# Patient Record
Sex: Male | Born: 1935
Health system: Southern US, Community
[De-identification: ages and names within clinical notes are randomized; demographics above are authoritative.]

## PROBLEM LIST (undated history)

## (undated) DIAGNOSIS — I639 Cerebral infarction, unspecified: Secondary | ICD-10-CM

## (undated) DIAGNOSIS — Z87442 Personal history of urinary calculi: Secondary | ICD-10-CM

## (undated) DIAGNOSIS — M1990 Unspecified osteoarthritis, unspecified site: Secondary | ICD-10-CM

## (undated) DIAGNOSIS — K219 Gastro-esophageal reflux disease without esophagitis: Secondary | ICD-10-CM

## (undated) DIAGNOSIS — N210 Calculus in bladder: Secondary | ICD-10-CM

## (undated) DIAGNOSIS — H9190 Unspecified hearing loss, unspecified ear: Secondary | ICD-10-CM

## (undated) HISTORY — PX: BACK SURGERY: SHX140

## (undated) HISTORY — PX: LITHOTRIPSY: SUR834

---

## 2004-04-04 ENCOUNTER — Inpatient Hospital Stay (HOSPITAL_COMMUNITY): Admission: RE | Admit: 2004-04-04 | Discharge: 2004-04-05 | Payer: Self-pay | Admitting: Neurosurgery

## 2006-03-23 DIAGNOSIS — I639 Cerebral infarction, unspecified: Secondary | ICD-10-CM

## 2006-03-23 HISTORY — DX: Cerebral infarction, unspecified: I63.9

## 2016-08-12 ENCOUNTER — Other Ambulatory Visit: Payer: Self-pay | Admitting: Neurosurgery

## 2016-08-12 DIAGNOSIS — M47816 Spondylosis without myelopathy or radiculopathy, lumbar region: Secondary | ICD-10-CM

## 2016-08-25 ENCOUNTER — Ambulatory Visit
Admission: RE | Admit: 2016-08-25 | Discharge: 2016-08-25 | Disposition: A | Discharge status: Home / Self Care | Payer: Medicare Other | Source: Ambulatory Visit | Attending: Neurosurgery | Admitting: Neurosurgery

## 2016-08-25 DIAGNOSIS — M47816 Spondylosis without myelopathy or radiculopathy, lumbar region: Secondary | ICD-10-CM

## 2016-08-25 MED ORDER — METHYLPREDNISOLONE ACETATE 40 MG/ML INJ SUSP (RADIOLOG
120.0000 mg | Freq: Once | INTRAMUSCULAR | Status: AC
  Administered 2016-08-25: 120 mg via INTRA_ARTICULAR

## 2016-08-25 MED ORDER — IOPAMIDOL (ISOVUE-M 200) INJECTION 41%
1.0000 mL | Freq: Once | INTRAMUSCULAR | Status: AC
  Administered 2016-08-25: 1 mL via INTRA_ARTICULAR

## 2016-08-25 NOTE — Discharge Instructions (Signed)

## 2016-12-11 ENCOUNTER — Other Ambulatory Visit: Payer: Self-pay | Admitting: Nurse Practitioner

## 2016-12-11 DIAGNOSIS — M47816 Spondylosis without myelopathy or radiculopathy, lumbar region: Secondary | ICD-10-CM

## 2016-12-24 ENCOUNTER — Ambulatory Visit
Admission: RE | Admit: 2016-12-24 | Discharge: 2016-12-24 | Disposition: A | Discharge status: Home / Self Care | Payer: Medicare Other | Source: Ambulatory Visit | Attending: Nurse Practitioner | Admitting: Nurse Practitioner

## 2016-12-24 DIAGNOSIS — M47816 Spondylosis without myelopathy or radiculopathy, lumbar region: Secondary | ICD-10-CM

## 2016-12-24 MED ORDER — IOPAMIDOL (ISOVUE-M 200) INJECTION 41%
10.0000 mL | Freq: Once | INTRAMUSCULAR | Status: AC
  Administered 2016-12-24: 10 mL via INTRA_ARTICULAR

## 2016-12-24 MED ORDER — METHYLPREDNISOLONE ACETATE 40 MG/ML INJ SUSP (RADIOLOG
120.0000 mg | Freq: Once | INTRAMUSCULAR | Status: AC
  Administered 2016-12-24: 120 mg via INTRA_ARTICULAR

## 2016-12-24 NOTE — Discharge Instructions (Signed)
Joint Injection Discharge Instructions  1. After your joint injection, use ice to the affected area for the next 24 hours as a temporary increase in pain is not uncommon for a day or two after your procedure.  2. Resume all medications unless otherwise instructed.  3. Common side effects of steroids include facial flushing or redness, restlessness or inability to sleep and an increase in your blood sugar if you are a diabetic.    4. Follow up with the ordering physician for post care.  5. If you have any of the following please call [phone]:      Temperature greater than 101     Pain, redness or swelling at the injection site   [place]!

## 2017-01-30 ENCOUNTER — Emergency Department (HOSPITAL_COMMUNITY): Payer: Medicare Other

## 2017-01-30 ENCOUNTER — Encounter (HOSPITAL_COMMUNITY): Payer: Self-pay | Admitting: Emergency Medicine

## 2017-01-30 ENCOUNTER — Emergency Department (HOSPITAL_COMMUNITY)
Admission: EM | Admit: 2017-01-30 | Discharge: 2017-01-30 | Disposition: A | Discharge status: Home / Self Care | Payer: Medicare Other | Attending: Emergency Medicine | Admitting: Emergency Medicine

## 2017-01-30 DIAGNOSIS — Y9389 Activity, other specified: Secondary | ICD-10-CM | POA: Diagnosis not present

## 2017-01-30 DIAGNOSIS — Y92411 Interstate highway as the place of occurrence of the external cause: Secondary | ICD-10-CM | POA: Insufficient documentation

## 2017-01-30 DIAGNOSIS — Y999 Unspecified external cause status: Secondary | ICD-10-CM | POA: Diagnosis not present

## 2017-01-30 DIAGNOSIS — R0789 Other chest pain: Secondary | ICD-10-CM | POA: Insufficient documentation

## 2017-01-30 DIAGNOSIS — M25552 Pain in left hip: Secondary | ICD-10-CM | POA: Diagnosis not present

## 2017-01-30 DIAGNOSIS — Z8673 Personal history of transient ischemic attack (TIA), and cerebral infarction without residual deficits: Secondary | ICD-10-CM | POA: Diagnosis not present

## 2017-01-30 HISTORY — DX: Cerebral infarction, unspecified: I63.9

## 2017-01-30 HISTORY — DX: Unspecified osteoarthritis, unspecified site: M19.90

## 2017-01-30 HISTORY — DX: Unspecified hearing loss, unspecified ear: H91.90
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
## 2017-01-30 NOTE — ED Notes (Signed)
Pt transported to radiology.
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
## 2017-09-24 ENCOUNTER — Ambulatory Visit: Payer: Medicare Other | Admitting: Urology

## 2017-09-24 DIAGNOSIS — R3915 Urgency of urination: Secondary | ICD-10-CM

## 2017-09-24 DIAGNOSIS — R351 Nocturia: Secondary | ICD-10-CM | POA: Diagnosis not present

## 2017-10-20 ENCOUNTER — Ambulatory Visit (INDEPENDENT_AMBULATORY_CARE_PROVIDER_SITE_OTHER): Payer: Medicare Other | Admitting: Urology

## 2017-10-20 DIAGNOSIS — R351 Nocturia: Secondary | ICD-10-CM

## 2017-10-20 DIAGNOSIS — R3915 Urgency of urination: Secondary | ICD-10-CM

## 2017-10-26 ENCOUNTER — Other Ambulatory Visit: Payer: Self-pay | Admitting: Neurological Surgery

## 2017-10-26 ENCOUNTER — Other Ambulatory Visit: Payer: Self-pay | Admitting: Nurse Practitioner

## 2017-10-26 DIAGNOSIS — M47816 Spondylosis without myelopathy or radiculopathy, lumbar region: Secondary | ICD-10-CM

## 2017-11-05 ENCOUNTER — Ambulatory Visit
Admission: RE | Admit: 2017-11-05 | Discharge: 2017-11-05 | Disposition: A | Discharge status: Home / Self Care | Payer: Medicare Other | Source: Ambulatory Visit | Attending: Nurse Practitioner | Admitting: Nurse Practitioner

## 2017-11-05 ENCOUNTER — Other Ambulatory Visit: Payer: Medicare Other

## 2017-11-05 DIAGNOSIS — M47816 Spondylosis without myelopathy or radiculopathy, lumbar region: Secondary | ICD-10-CM

## 2017-11-05 MED ORDER — METHYLPREDNISOLONE ACETATE 40 MG/ML INJ SUSP (RADIOLOG
120.0000 mg | Freq: Once | INTRAMUSCULAR | Status: AC
  Administered 2017-11-05: 120 mg via INTRA_ARTICULAR

## 2017-11-05 MED ORDER — IOPAMIDOL (ISOVUE-M 200) INJECTION 41%
1.0000 mL | Freq: Once | INTRAMUSCULAR | Status: AC
  Administered 2017-11-05: 1 mL via INTRA_ARTICULAR

## 2017-11-05 NOTE — Discharge Instructions (Signed)

## 2018-02-01 ENCOUNTER — Other Ambulatory Visit: Payer: Self-pay | Admitting: Nurse Practitioner

## 2018-02-01 DIAGNOSIS — M47816 Spondylosis without myelopathy or radiculopathy, lumbar region: Secondary | ICD-10-CM

## 2018-02-10 ENCOUNTER — Ambulatory Visit
Admission: RE | Admit: 2018-02-10 | Discharge: 2018-02-10 | Disposition: A | Discharge status: Home / Self Care | Payer: Medicare Other | Source: Ambulatory Visit | Attending: Nurse Practitioner | Admitting: Nurse Practitioner

## 2018-02-10 DIAGNOSIS — M47816 Spondylosis without myelopathy or radiculopathy, lumbar region: Secondary | ICD-10-CM

## 2018-02-10 MED ORDER — IOPAMIDOL (ISOVUE-M 200) INJECTION 41%
1.0000 mL | Freq: Once | INTRAMUSCULAR | Status: AC
  Administered 2018-02-10: 1 mL via INTRA_ARTICULAR

## 2018-02-10 MED ORDER — METHYLPREDNISOLONE ACETATE 40 MG/ML INJ SUSP (RADIOLOG
120.0000 mg | Freq: Once | INTRAMUSCULAR | Status: AC
  Administered 2018-02-10: 120 mg via INTRA_ARTICULAR

## 2018-02-10 NOTE — Discharge Instructions (Signed)

## 2018-02-22 ENCOUNTER — Other Ambulatory Visit: Payer: Self-pay | Admitting: Neurosurgery

## 2018-02-22 DIAGNOSIS — M7062 Trochanteric bursitis, left hip: Secondary | ICD-10-CM

## 2018-03-03 ENCOUNTER — Ambulatory Visit
Admission: RE | Admit: 2018-03-03 | Discharge: 2018-03-03 | Disposition: A | Discharge status: Home / Self Care | Payer: Medicare Other | Source: Ambulatory Visit | Attending: Neurosurgery | Admitting: Neurosurgery

## 2018-03-03 DIAGNOSIS — M7062 Trochanteric bursitis, left hip: Secondary | ICD-10-CM

## 2018-03-03 MED ORDER — METHYLPREDNISOLONE ACETATE 40 MG/ML INJ SUSP (RADIOLOG
120.0000 mg | Freq: Once | INTRAMUSCULAR | Status: AC
  Administered 2018-03-03: 120 mg via INTRA_ARTICULAR

## 2018-03-03 MED ORDER — IOPAMIDOL (ISOVUE-M 200) INJECTION 41%
1.0000 mL | Freq: Once | INTRAMUSCULAR | Status: AC
  Administered 2018-03-03: 1 mL via INTRA_ARTICULAR

## 2018-03-03 NOTE — Discharge Instructions (Signed)

## 2018-06-10 ENCOUNTER — Other Ambulatory Visit: Payer: Self-pay

## 2018-06-10 NOTE — Patient Outreach (Signed)
[facility]) Care Management  06/10/2018  Seaton Hofmann [birth year] [id number]   Medication Adherence call to Mr. Katie Moch HIPPA Compliant Voice message left with a call back number. Mr. Veronica is showing past due under [place].   [place] Management Direct Dial [phone]  Fax [phone] [email or web address]

## 2019-04-30 ENCOUNTER — Ambulatory Visit: Payer: Medicare PPO

## 2019-05-17 ENCOUNTER — Ambulatory Visit: Payer: Self-pay

## 2019-09-20 DIAGNOSIS — J449 Chronic obstructive pulmonary disease, unspecified: Secondary | ICD-10-CM | POA: Diagnosis not present

## 2019-09-20 DIAGNOSIS — N183 Chronic kidney disease, stage 3 unspecified: Secondary | ICD-10-CM | POA: Diagnosis not present

## 2019-09-20 DIAGNOSIS — Z87891 Personal history of nicotine dependence: Secondary | ICD-10-CM | POA: Diagnosis not present

## 2019-09-20 DIAGNOSIS — I129 Hypertensive chronic kidney disease with stage 1 through stage 4 chronic kidney disease, or unspecified chronic kidney disease: Secondary | ICD-10-CM | POA: Diagnosis not present

## 2019-09-20 DIAGNOSIS — E1122 Type 2 diabetes mellitus with diabetic chronic kidney disease: Secondary | ICD-10-CM | POA: Diagnosis not present

## 2020-01-04 DIAGNOSIS — E039 Hypothyroidism, unspecified: Secondary | ICD-10-CM | POA: Diagnosis not present

## 2020-01-04 DIAGNOSIS — E782 Mixed hyperlipidemia: Secondary | ICD-10-CM | POA: Diagnosis not present

## 2020-01-04 DIAGNOSIS — K219 Gastro-esophageal reflux disease without esophagitis: Secondary | ICD-10-CM | POA: Diagnosis not present

## 2020-01-04 DIAGNOSIS — E1165 Type 2 diabetes mellitus with hyperglycemia: Secondary | ICD-10-CM | POA: Diagnosis not present

## 2020-01-04 DIAGNOSIS — I10 Essential (primary) hypertension: Secondary | ICD-10-CM | POA: Diagnosis not present

## 2020-01-09 DIAGNOSIS — Z0001 Encounter for general adult medical examination with abnormal findings: Secondary | ICD-10-CM | POA: Diagnosis not present

## 2020-01-09 DIAGNOSIS — J301 Allergic rhinitis due to pollen: Secondary | ICD-10-CM | POA: Diagnosis not present

## 2020-01-09 DIAGNOSIS — E782 Mixed hyperlipidemia: Secondary | ICD-10-CM | POA: Diagnosis not present

## 2020-01-09 DIAGNOSIS — I10 Essential (primary) hypertension: Secondary | ICD-10-CM | POA: Diagnosis not present

## 2020-01-09 DIAGNOSIS — N400 Enlarged prostate without lower urinary tract symptoms: Secondary | ICD-10-CM | POA: Diagnosis not present

## 2020-01-09 DIAGNOSIS — E1165 Type 2 diabetes mellitus with hyperglycemia: Secondary | ICD-10-CM | POA: Diagnosis not present

## 2020-01-09 DIAGNOSIS — K432 Incisional hernia without obstruction or gangrene: Secondary | ICD-10-CM | POA: Diagnosis not present

## 2020-01-09 DIAGNOSIS — Z6830 Body mass index (BMI) 30.0-30.9, adult: Secondary | ICD-10-CM | POA: Diagnosis not present

## 2020-01-11 DIAGNOSIS — H524 Presbyopia: Secondary | ICD-10-CM | POA: Diagnosis not present

## 2020-01-11 DIAGNOSIS — Z961 Presence of intraocular lens: Secondary | ICD-10-CM | POA: Diagnosis not present

## 2020-03-27 DIAGNOSIS — M549 Dorsalgia, unspecified: Secondary | ICD-10-CM | POA: Diagnosis not present

## 2020-03-27 DIAGNOSIS — M25552 Pain in left hip: Secondary | ICD-10-CM | POA: Diagnosis not present

## 2020-05-03 DIAGNOSIS — E1165 Type 2 diabetes mellitus with hyperglycemia: Secondary | ICD-10-CM | POA: Diagnosis not present

## 2020-05-03 DIAGNOSIS — K2100 Gastro-esophageal reflux disease with esophagitis, without bleeding: Secondary | ICD-10-CM | POA: Diagnosis not present

## 2020-05-03 DIAGNOSIS — E7849 Other hyperlipidemia: Secondary | ICD-10-CM | POA: Diagnosis not present

## 2020-05-03 DIAGNOSIS — I10 Essential (primary) hypertension: Secondary | ICD-10-CM | POA: Diagnosis not present

## 2020-05-03 DIAGNOSIS — E782 Mixed hyperlipidemia: Secondary | ICD-10-CM | POA: Diagnosis not present

## 2020-05-03 DIAGNOSIS — N183 Chronic kidney disease, stage 3 unspecified: Secondary | ICD-10-CM | POA: Diagnosis not present

## 2020-05-08 DIAGNOSIS — K432 Incisional hernia without obstruction or gangrene: Secondary | ICD-10-CM | POA: Diagnosis not present

## 2020-05-08 DIAGNOSIS — I10 Essential (primary) hypertension: Secondary | ICD-10-CM | POA: Diagnosis not present

## 2020-05-08 DIAGNOSIS — K2100 Gastro-esophageal reflux disease with esophagitis, without bleeding: Secondary | ICD-10-CM | POA: Diagnosis not present

## 2020-05-08 DIAGNOSIS — J449 Chronic obstructive pulmonary disease, unspecified: Secondary | ICD-10-CM | POA: Diagnosis not present

## 2020-05-08 DIAGNOSIS — E1165 Type 2 diabetes mellitus with hyperglycemia: Secondary | ICD-10-CM | POA: Diagnosis not present

## 2020-05-08 DIAGNOSIS — J301 Allergic rhinitis due to pollen: Secondary | ICD-10-CM | POA: Diagnosis not present

## 2020-05-08 DIAGNOSIS — E7849 Other hyperlipidemia: Secondary | ICD-10-CM | POA: Diagnosis not present

## 2020-05-08 DIAGNOSIS — N400 Enlarged prostate without lower urinary tract symptoms: Secondary | ICD-10-CM | POA: Diagnosis not present

## 2020-05-10 DIAGNOSIS — Z9181 History of falling: Secondary | ICD-10-CM | POA: Diagnosis not present

## 2020-05-10 DIAGNOSIS — M6281 Muscle weakness (generalized): Secondary | ICD-10-CM | POA: Diagnosis not present

## 2020-05-10 DIAGNOSIS — M545 Low back pain, unspecified: Secondary | ICD-10-CM | POA: Diagnosis not present

## 2020-05-14 DIAGNOSIS — M6281 Muscle weakness (generalized): Secondary | ICD-10-CM | POA: Diagnosis not present

## 2020-05-14 DIAGNOSIS — Z9181 History of falling: Secondary | ICD-10-CM | POA: Diagnosis not present

## 2020-05-14 DIAGNOSIS — M545 Low back pain, unspecified: Secondary | ICD-10-CM | POA: Diagnosis not present

## 2020-05-16 DIAGNOSIS — M545 Low back pain, unspecified: Secondary | ICD-10-CM | POA: Diagnosis not present

## 2020-05-16 DIAGNOSIS — Z9181 History of falling: Secondary | ICD-10-CM | POA: Diagnosis not present

## 2020-05-16 DIAGNOSIS — M6281 Muscle weakness (generalized): Secondary | ICD-10-CM | POA: Diagnosis not present

## 2020-05-20 DIAGNOSIS — M545 Low back pain, unspecified: Secondary | ICD-10-CM | POA: Diagnosis not present

## 2020-05-20 DIAGNOSIS — Z9181 History of falling: Secondary | ICD-10-CM | POA: Diagnosis not present

## 2020-05-20 DIAGNOSIS — M6281 Muscle weakness (generalized): Secondary | ICD-10-CM | POA: Diagnosis not present

## 2020-05-22 DIAGNOSIS — Z9181 History of falling: Secondary | ICD-10-CM | POA: Diagnosis not present

## 2020-05-22 DIAGNOSIS — M6281 Muscle weakness (generalized): Secondary | ICD-10-CM | POA: Diagnosis not present

## 2020-05-22 DIAGNOSIS — M545 Low back pain, unspecified: Secondary | ICD-10-CM | POA: Diagnosis not present

## 2020-05-23 DIAGNOSIS — R300 Dysuria: Secondary | ICD-10-CM | POA: Diagnosis not present

## 2020-05-27 DIAGNOSIS — M6281 Muscle weakness (generalized): Secondary | ICD-10-CM | POA: Diagnosis not present

## 2020-05-27 DIAGNOSIS — M545 Low back pain, unspecified: Secondary | ICD-10-CM | POA: Diagnosis not present

## 2020-05-27 DIAGNOSIS — Z9181 History of falling: Secondary | ICD-10-CM | POA: Diagnosis not present

## 2020-05-29 DIAGNOSIS — M6281 Muscle weakness (generalized): Secondary | ICD-10-CM | POA: Diagnosis not present

## 2020-05-29 DIAGNOSIS — M545 Low back pain, unspecified: Secondary | ICD-10-CM | POA: Diagnosis not present

## 2020-05-29 DIAGNOSIS — Z9181 History of falling: Secondary | ICD-10-CM | POA: Diagnosis not present

## 2020-06-03 DIAGNOSIS — M6281 Muscle weakness (generalized): Secondary | ICD-10-CM | POA: Diagnosis not present

## 2020-06-03 DIAGNOSIS — M545 Low back pain, unspecified: Secondary | ICD-10-CM | POA: Diagnosis not present

## 2020-06-03 DIAGNOSIS — Z9181 History of falling: Secondary | ICD-10-CM | POA: Diagnosis not present

## 2020-06-05 DIAGNOSIS — Z9181 History of falling: Secondary | ICD-10-CM | POA: Diagnosis not present

## 2020-06-05 DIAGNOSIS — M6281 Muscle weakness (generalized): Secondary | ICD-10-CM | POA: Diagnosis not present

## 2020-06-05 DIAGNOSIS — M545 Low back pain, unspecified: Secondary | ICD-10-CM | POA: Diagnosis not present

## 2020-06-10 DIAGNOSIS — H26493 Other secondary cataract, bilateral: Secondary | ICD-10-CM | POA: Diagnosis not present

## 2020-06-12 DIAGNOSIS — Z9181 History of falling: Secondary | ICD-10-CM | POA: Diagnosis not present

## 2020-06-12 DIAGNOSIS — M545 Low back pain, unspecified: Secondary | ICD-10-CM | POA: Diagnosis not present

## 2020-06-12 DIAGNOSIS — M6281 Muscle weakness (generalized): Secondary | ICD-10-CM | POA: Diagnosis not present

## 2020-06-15 DIAGNOSIS — N390 Urinary tract infection, site not specified: Secondary | ICD-10-CM | POA: Diagnosis not present

## 2020-06-15 DIAGNOSIS — R350 Frequency of micturition: Secondary | ICD-10-CM | POA: Diagnosis not present

## 2020-06-17 DIAGNOSIS — Z9181 History of falling: Secondary | ICD-10-CM | POA: Diagnosis not present

## 2020-06-17 DIAGNOSIS — M545 Low back pain, unspecified: Secondary | ICD-10-CM | POA: Diagnosis not present

## 2020-06-17 DIAGNOSIS — M6281 Muscle weakness (generalized): Secondary | ICD-10-CM | POA: Diagnosis not present

## 2020-07-08 DIAGNOSIS — N403 Nodular prostate with lower urinary tract symptoms: Secondary | ICD-10-CM | POA: Diagnosis not present

## 2020-07-08 DIAGNOSIS — N200 Calculus of kidney: Secondary | ICD-10-CM | POA: Diagnosis not present

## 2020-07-08 DIAGNOSIS — N3941 Urge incontinence: Secondary | ICD-10-CM | POA: Diagnosis not present

## 2020-07-08 DIAGNOSIS — N210 Calculus in bladder: Secondary | ICD-10-CM | POA: Diagnosis not present

## 2020-07-08 DIAGNOSIS — R8279 Other abnormal findings on microbiological examination of urine: Secondary | ICD-10-CM | POA: Diagnosis not present

## 2020-07-08 DIAGNOSIS — R350 Frequency of micturition: Secondary | ICD-10-CM | POA: Diagnosis not present

## 2020-07-12 ENCOUNTER — Other Ambulatory Visit: Payer: Self-pay | Admitting: Urology

## 2020-07-12 DIAGNOSIS — N402 Nodular prostate without lower urinary tract symptoms: Secondary | ICD-10-CM

## 2020-07-15 ENCOUNTER — Other Ambulatory Visit (HOSPITAL_COMMUNITY): Payer: Self-pay | Admitting: Urology

## 2020-07-15 ENCOUNTER — Other Ambulatory Visit: Payer: Self-pay | Admitting: Urology

## 2020-07-15 DIAGNOSIS — N402 Nodular prostate without lower urinary tract symptoms: Secondary | ICD-10-CM

## 2020-07-16 ENCOUNTER — Other Ambulatory Visit (HOSPITAL_COMMUNITY): Payer: Self-pay | Admitting: Urology

## 2020-07-16 DIAGNOSIS — N402 Nodular prostate without lower urinary tract symptoms: Secondary | ICD-10-CM

## 2020-07-17 DIAGNOSIS — H26493 Other secondary cataract, bilateral: Secondary | ICD-10-CM | POA: Diagnosis not present

## 2020-07-18 NOTE — Progress Notes (Addendum)
COVID Vaccine Completed: x3 Date COVID Vaccine completed: 04-15-19  & 05-15-19 Has received booster: 4-22 COVID vaccine manufacturer:   Moderna   Date of COVID positive in last 90 days:  N/A  PCP - Gar Ponto, MD Cardiologist - N/A  Chest x-ray - N/A EKG - N/A Stress Test - N/A ECHO - N/A Cardiac Cath - N/A Pacemaker/ICD device last checked: Spinal Cord Stimulator:  Sleep Study - N/A CPAP -   Fasting Blood Sugar - N/A Checks Blood Sugar _____ times a day  Blood Thinner Instructions:  Plavix 75 mg.  Ok to hold 5 days per Dr. Quillian Quince.  Patient aware Aspirin Instructions: Last Dose:  Activity level:  Can go up a flight of stairs and perform activities of daily living without stopping and without symptoms of chest pain or shortness of breath.  Pt does have some limitations due to back pain.  Patient lives alone and performs all ADLs independently.      Anesthesia review: N/A  Patient denies shortness of breath, fever, cough and chest pain at PAT appointment   Patient verbalized understanding of instructions that were given to them at the PAT appointment. Patient was also instructed that they will need to review over the PAT instructions again at home before surgery.

## 2020-07-18 NOTE — Patient Instructions (Addendum)
DUE TO COVID-19 ONLY ONE VISITOR IS ALLOWED TO COME WITH YOU AND STAY IN THE WAITING ROOM ONLY DURING PRE OP AND PROCEDURE.   **NO VISITORS ARE ALLOWED IN THE SHORT STAY AREA OR RECOVERY ROOM!!**  IF YOU WILL BE ADMITTED INTO THE HOSPITAL YOU ARE ALLOWED ONLY TWO SUPPORT PEOPLE DURING VISITATION HOURS ONLY (10AM -8PM)   . The support person(s) may change daily. . The support person(s) must pass our screening, gel in and out, and wear a mask at all times, including in the patient's room. . Patients must also wear a mask when staff or their support person are in the room.  No visitors under the age of 12. Any visitor under the age of 2 must be accompanied by an adult.    COVID SWAB TESTING MUST BE COMPLETED ON:  Wednesday, 07-24-20 @ 2:00 PM   [address] [place], [place] [address]  (Must self quarantine after testing. Follow instructions on handout.)        Your procedure is scheduled on:  Friday, 07-26-20   Report to [facility] Main  Entrance    Report to admitting at 10:00 AM   Call this number if you have problems the morning of surgery [phone]   Do not eat food :After Midnight.   May have liquids until 9:00 AM day of surgery  CLEAR LIQUID DIET  Foods Allowed                                                                     Foods Excluded  Water, Black Coffee and tea, regular and decaf             liquids that you cannot  Plain Jell-O in any flavor  (No red)                                    see through such as: Fruit ices (not with fruit pulp)                                      milk, soups, orange juice              Iced Popsicles (No red)                                      All solid food                                   Apple juices Sports drinks like Gatorade (No red) Lightly seasoned clear broth or consume(fat free) Sugar, honey syrup     Oral Hygiene is also important to reduce your risk of infection.                                    Remember  - BRUSH YOUR TEETH THE MORNING OF SURGERY WITH  YOUR REGULAR TOOTHPASTE   Do NOT smoke after Midnight   Take these medicines the morning of surgery with A SIP OF WATER: Allopurinol, Pantoprazole, Silodosin                               You may not have any metal on your body including  jewelry, and body piercings             Do not wear  lotions, powders,cologne, or deodorant              Men may shave face and neck.   Do not bring valuables to the hospital. [place].   Contacts, dentures or bridgework may not be worn into surgery.   Bring small overnight bag day of surgery.    Special Instructions: Bring a copy of your healthcare power of attorney and living will documents         the day of surgery if you haven't  scanned them in before.              Please read over the following fact sheets you were given: IF YOU HAVE QUESTIONS ABOUT YOUR PRE OP INSTRUCTIONS PLEASE CALL  [facility] - Preparing for Surgery Before surgery, you can play an important role.  Because skin is not sterile, your skin needs to be as free of germs as possible.  You can reduce the number of germs on your skin by washing with CHG (chlorahexidine gluconate) soap before surgery.  CHG is an antiseptic cleaner which kills germs and bonds with the skin to continue killing germs even after washing. Please DO NOT use if you have an allergy to CHG or antibacterial soaps.  If your skin becomes reddened/irritated stop using the CHG and inform your nurse when you arrive at Short Stay. Do not shave (including legs and underarms) for at least 48 hours prior to the first CHG shower.  You may shave your face/neck.  Please follow these instructions carefully:  1.  Shower with CHG Soap the night before surgery and the  morning of surgery.  2.  If you choose to wash your hair, wash your hair first as usual with your normal  shampoo.  3.  After you shampoo, rinse your hair  and body thoroughly to remove the shampoo.                             4.  Use CHG as you would any other liquid soap.  You can apply chg directly to the skin and wash.  Gently with a scrungie or clean washcloth.  5.  Apply the CHG Soap to your body ONLY FROM THE NECK DOWN.   Do   not use on face/ open                           Wound or open sores. Avoid contact with eyes, ears mouth and   genitals (private parts).                       Wash face,  Genitals (private parts) with your normal soap.             6.  Wash thoroughly, paying special attention to the area where your  surgery  will be performed.  7.  Thoroughly rinse your body with warm water from the neck down.  8.  DO NOT shower/wash with your normal soap after using and rinsing off the CHG Soap.                9.  Pat yourself dry with a clean towel.            10.  Wear clean pajamas.            11.  Place clean sheets on your bed the night of your first shower and do not  sleep with pets. Day of Surgery : Do not apply any lotions/deodorants the morning of surgery.  Please wear clean clothes to the hospital/surgery center.  FAILURE TO FOLLOW THESE INSTRUCTIONS MAY RESULT IN THE CANCELLATION OF YOUR SURGERY  PATIENT SIGNATURE_________________________________  NURSE SIGNATURE__________________________________  ________________________________________________________________________

## 2020-07-19 ENCOUNTER — Encounter (HOSPITAL_COMMUNITY): Payer: Self-pay

## 2020-07-19 ENCOUNTER — Other Ambulatory Visit: Payer: Self-pay

## 2020-07-19 ENCOUNTER — Encounter (HOSPITAL_COMMUNITY)
Admission: RE | Admit: 2020-07-19 | Discharge: 2020-07-19 | Disposition: A | Discharge status: Home / Self Care | Payer: Medicare PPO | Source: Ambulatory Visit | Attending: Urology | Admitting: Urology

## 2020-07-19 DIAGNOSIS — Z01812 Encounter for preprocedural laboratory examination: Secondary | ICD-10-CM | POA: Insufficient documentation

## 2020-07-19 HISTORY — DX: Personal history of urinary calculi: Z87.442

## 2020-07-19 HISTORY — DX: Calculus in bladder: N21.0

## 2020-07-19 HISTORY — DX: Gastro-esophageal reflux disease without esophagitis: K21.9

## 2020-07-19 LAB — CBC
HCT: 43.4 % (ref 39.0–52.0)
Hemoglobin: 14.2 g/dL (ref 13.0–17.0)
MCH: 34.1 pg — ABNORMAL HIGH (ref 26.0–34.0)
MCHC: 32.7 g/dL (ref 30.0–36.0)
MCV: 104.1 fL — ABNORMAL HIGH (ref 80.0–100.0)
Platelets: 208 10*3/uL (ref 150–400)
RBC: 4.17 MIL/uL — ABNORMAL LOW (ref 4.22–5.81)
RDW: 13.5 % (ref 11.5–15.5)
WBC: 6.1 10*3/uL (ref 4.0–10.5)
nRBC: 0 % (ref 0.0–0.2)

## 2020-07-19 LAB — BASIC METABOLIC PANEL
Anion gap: 9 (ref 5–15)
BUN: 18 mg/dL (ref 8–23)
CO2: 25 mmol/L (ref 22–32)
Calcium: 9.6 mg/dL (ref 8.9–10.3)
Chloride: 109 mmol/L (ref 98–111)
Creatinine, Ser: 1.17 mg/dL (ref 0.61–1.24)
GFR, Estimated: 57 mL/min — ABNORMAL LOW (ref >60)
Glucose, Bld: 241 mg/dL — ABNORMAL HIGH (ref 70–99)
Potassium: 4.6 mmol/L (ref 3.5–5.1)
Sodium: 143 mmol/L (ref 135–145)

## 2020-07-19 LAB — HEMOGLOBIN A1C
Hgb A1c MFr Bld: 6.7 % — ABNORMAL HIGH (ref 4.8–5.6)
Mean Plasma Glucose: 145.59 mg/dL

## 2020-07-22 ENCOUNTER — Encounter (HOSPITAL_COMMUNITY): Payer: Medicare PPO

## 2020-07-24 ENCOUNTER — Other Ambulatory Visit (HOSPITAL_COMMUNITY)
Admission: RE | Admit: 2020-07-24 | Discharge: 2020-07-24 | Disposition: A | Discharge status: Home / Self Care | Payer: Medicare PPO | Source: Ambulatory Visit | Attending: Urology | Admitting: Urology

## 2020-07-24 DIAGNOSIS — Z20822 Contact with and (suspected) exposure to covid-19: Secondary | ICD-10-CM | POA: Diagnosis not present

## 2020-07-24 DIAGNOSIS — Z01812 Encounter for preprocedural laboratory examination: Secondary | ICD-10-CM | POA: Insufficient documentation

## 2020-07-24 LAB — SARS CORONAVIRUS 2 (TAT 6-24 HRS): SARS Coronavirus 2: NEGATIVE

## 2020-07-25 MED ORDER — GENTAMICIN SULFATE 40 MG/ML IJ SOLN
5.0000 mg/kg | INTRAVENOUS | Status: AC
  Administered 2020-07-26: 449.2 mg via INTRAVENOUS
  Filled 2020-07-25: qty 11.25

## 2020-07-25 NOTE — H&P (Signed)
Frequency, Nocturia and Urgency     Devon Davis is a 87 yo male who has a 3 week history of frequency and nocturia. he has some dysuria. He has urgency and has UUI. He has been wearing depends. He has no hematuria. He was treated with bactrim and cefuroxime in the last couple of weeks. He has no hesitancy and has a good stream. He has some intermittency but feels he empties. He has a history of BPH with BOO and has been on silodosin. He was on Myrbetriq previously but had HTN. He has a history of stones with prior stone extractions.     ALLERGIES: Myrbetriq - Increase in blood pressure    MEDICATIONS: Allopurinol 300 mg tablet Oral  Atorvastatin Calcium 10 mg tablet 1 tablet PO Daily  Azo  Azo Bladder Control  Cefuroxime 500 mg tablet  Hydrocodone-Acetaminophen 5 mg-325 mg tablet  Pantoprazole Sodium 40 mg tablet, delayed release  Plavix 75 MG Oral Tablet Oral  Saw [place]  Silodosin 8 mg capsule  Sulfamethoxazole-Trimethoprim  Tylenol     GU PSH: None     PSH Notes: Hernia Repair, Throat Surgery, Spine Repair, Cataract Surgery, Hip Surgery   Shots in back periodically for severe arthritis   Back sx   Kidney stone surgery   NON-GU PSH: Back surgery Carpal tunnel surgery Hernia Repair - 2010     GU PMH: Nocturia - 2019, - 2019, Nocturia, - 2014 Urinary Urgency - 2019, - 2019 Gross hematuria, Gross hematuria - 2014 History of urolithiasis, Nephrolithiasis - 2014 Low back pain, Lower back pain - 2014 Obstructive and reflux uropathy, Unspec, Obstructive uropathy - 2014 Renal calculus, Bilateral kidney stones - 2014      PMH Notes:  1898-03-23 00:00:00 - Note: Normal Routine History And Physical Geriatric (80 +)  2009-06-06 11:34:41 - Note: Bladder Calculus   NON-GU PMH: Asthma, Asthma - 2014 Congenital single renal cyst, Congenital renal cyst, single - 2014 Personal history of other specified conditions, History of heartburn - 2014 Personal history of transient  ischemic attack (TIA), and cerebral infarction without residual deficits, History of transient cerebral ischemia - 2014 Arthritis Diabetes Type 2 GERD Hypertension Stroke/TIA    FAMILY HISTORY: Death of family member - Mother, Father Lung Cancer - Son Tuberculosis - Brother   SOCIAL HISTORY: Marital Status: Widowed Preferred Language: English; Ethnicity: Not Hispanic Or Latino; Race: White Current Smoking Status: Patient has never smoked.   Tobacco Use Assessment Completed: Used Tobacco in last 30 days? Uses smokeless tobacco. Does drink.  Drinks 1 caffeinated drink per day. Patient's occupation is/was retired.     Notes: Caffeine Use, Marital History - Currently Married, Alcohol Use, Tobacco Use, Occupation:   REVIEW OF SYSTEMS:    GU Review Male:   Patient reports frequent urination, hard to postpone urination, and get up at night to urinate. Patient denies burning/ pain with urination, leakage of urine, stream starts and stops, trouble starting your stream, have to strain to urinate , erection problems, and penile pain.  Gastrointestinal (Upper):   Patient reports indigestion/ heartburn. Patient denies nausea and vomiting.  Gastrointestinal (Lower):   Patient denies diarrhea and constipation.  Constitutional:   Patient denies night sweats, fever, weight loss, and fatigue.  Skin:   Patient denies skin rash/ lesion and itching.  Eyes:   Patient reports blurred vision. Patient denies double vision.  Ears/ Nose/ Throat:   Patient denies sore throat and sinus problems.  Hematologic/Lymphatic:   Patient reports easy bruising. Patient denies  swollen glands.  Cardiovascular:   Patient denies leg swelling and chest pains.  Respiratory:   Patient denies cough and shortness of breath.  Endocrine:   Patient denies excessive thirst.  Musculoskeletal:   Patient reports back pain and joint pain.   Neurological:   Patient reports dizziness. Patient denies headaches.  Psychologic:   Patient  denies depression and anxiety.   VITAL SIGNS:      07/08/2020 02:18 PM  Weight 200 lb / 90.72 kg  Height 70 in / 177.8 cm  BP 203/75 mmHg  Pulse 109 /min  Temperature 98.9 F / 37.1 C  BMI 28.7 kg/m   GU PHYSICAL EXAMINATION:    Anus and Perineum: No hemorrhoids. No anal stenosis. No rectal fissure, no anal fissure. No edema, no dimple, no perineal tenderness, no anal tenderness.  Scrotum: No lesions. No edema. No cysts. No warts.  Epididymides: Right: no spermatocele, no masses, no cysts, no tenderness, no induration, no enlargement. Left: no spermatocele, no masses, no cysts, no tenderness, no induration, no enlargement.  Testes: No tenderness, no swelling, no enlargement left testes. No tenderness, no swelling, no enlargement right testes. Normal location left testes. Normal location right testes. No mass, no cyst, no varicocele, no hydrocele left testes. No mass, no cyst, no varicocele, no hydrocele right testes.  Urethral Meatus: Normal size. No lesion, no wart, no discharge, no polyp. Normal location.  Penis: Circumcised, no warts, no cracks. No dorsal Peyronie's plaques, no left corporal Peyronie's plaques, no right corporal Peyronie's plaques, no scarring, no warts. No balanitis, no meatal stenosis.  Prostate: Prostate 2 1/2+ size. Very irregular, nodular prostate, right >left.   Seminal Vesicles: Nonpalpable.  Sphincter Tone: Normal sphincter. No rectal tenderness. No rectal mass.    MULTI-SYSTEM PHYSICAL EXAMINATION:    Constitutional: Obese. No physical deformities. Normally developed. Good grooming.   Respiratory: Normal breath sounds. No labored breathing, no use of accessory muscles.   Cardiovascular: Regular rate and rhythm. No murmur, no gallop.   Lymphatic: No enlargement, no tenderness of groin lymph nodes.  Skin: No paleness, no jaundice, no cyanosis. No lesion, no ulcer, no rash.  Neurologic / Psychiatric: Oriented to time, oriented to place, oriented to person. No  depression, no anxiety, no agitation.  Gastrointestinal: Obese abdomen. No hernia. No mass, no tenderness, no rigidity.   Musculoskeletal: Normal gait and station of head and neck.     Complexity of Data:  Records Review:   AUA Symptom Score, Previous Patient Records  Urine Test Review:   Urinalysis  X-Ray Review: Renal Ultrasound: Reviewed Films. Discussed With Patient.     PROCEDURES:         Renal Ultrasound - 29937  Right Kidney: Length: 11.7 cm Depth: 5.2 cm Cortical Width: 1.0 cm Width: 4.9 cm  Left Kidney: Length: 12.3 cm Depth: 6.9 cm Cortical Width: 1.4 cm Width: 5.5 cm  Left Kidney/Ureter:  2 Non-obstructing calcifications. 2 cysts LP.  Right Kidney/Ureter:  2 non-obstructing calcifications. Cyst LP  Bladder:  Multiple bladder stones. Largest 2.03 cm PVR 28.99 ml      Patient confirmed No Neulasta OnPro Device.           UTI+ - [id number], [id number], [id number], [id number], [id number], [id number], [id number] UTI Plus has been ordered to detect urinary organisms by PCR.          Urinalysis w/Scope Dipstick Dipstick Cont'd Micro  Color: Yellow Bilirubin: Neg mg/dL WBC/hpf: Packed/hpf  Appearance: Cloudy Ketones: Neg mg/dL RBC/hpf: 0 - 2/hpf  Specific  Gravity: 1.025 Blood: Neg ery/uL Bacteria: Rare (0-9/hpf)  pH: 6.0 Protein: 2+ mg/dL Cystals: NS (Not Seen)  Glucose: Neg mg/dL Urobilinogen: 0.2 mg/dL Casts: NS (Not Seen)    Nitrites: Neg Trichomonas: Not Present    Leukocyte Esterase: 3+ leu/uL Mucous: Not Present      Epithelial Cells: 0 - 5/hpf      Yeast: NS (Not Seen)      Sperm: Not Present    ASSESSMENT:      ICD-10 Details  1 GU:   Prostate nodule w/ LUTS - N40.3 Undiagnosed New Problem - He has a large very nodular prostate that is worrisome for locally advanced prostate cancer and the [place] today suggested soft tissue at the bladder base in addition to the bladder stones. I will get a PSA today and plan to do a prostate [place] and biopsy and possible channel TURP at the time of  cystolithalopaxy. I reviewd the risks of a TURP including bleeding, infection, incontinence, stricture, need for secondary procedures, ejaculatory and erectile dysfunction, thrombotic events, fluid overload and anesthetic complications. I explained that 95% of men will have relief of the obstructive symptoms and about 70% will have relief of the irritative symptoms.   2   Urinary Frequency - R35.0 Chronic, Worsening - Since his PVR was low and he has been given two rounds of antibiotics, I will give him samples of Gemtesa to see if that will help the OAB symptoms.   3   Nocturia - R35.1 Chronic, Worsening  4   Urinary Urgency - R39.15 Chronic, Worsening  5   Urge incontinence - N39.41 Undiagnosed New Problem  7   Bladder Stone - N21.0 Undiagnosed New Problem - He has multple possible bladder stones with the largest 21mm. I will set him up for cystolithalopaxy.   8   Renal calculus - N20.0 Chronic, Worsening - He has a history of stones. I will assess these further with a CT hematuria study which I am obtaining to assess the prostate anatomy as well as the bladder stones prior to surgical management.   6 NON-GU:   Pyuria/other UA findings - R82.79 Undiagnosed New Problem - UTI+ done to assess for UTI.   PLAN:           Orders Labs PSA, CBC with Diff, CMP  X-Rays: C.T. Hematuria With and Without I.V. Contrast - He has multiple bladder and renal stones and a nodular prostate with probable prostate cancer with some possible bladder base involvement. We can get this at [facility] or [facility] to reduce his travel.     Renal Ultrasound - Check PVR and r/o stones or hydro.           Schedule Return Visit/Planned Activity: Next Available Appointment - Schedule Surgery  Procedure: Unspecified Date - Cystoscopy TURP - 52601  Procedure: Unspecified Date - Cysto Bladder Stone >2.5cm - 06301          Document Letter(s):  Created for Patient: Clinical Summary   Addendum:  His PSA was 63 on labs at  his last visit.

## 2020-07-26 ENCOUNTER — Observation Stay (HOSPITAL_COMMUNITY)
Admission: RE | Admit: 2020-07-26 | Discharge: 2020-07-28 | Disposition: A | Discharge status: Home / Self Care | Payer: Medicare PPO | Source: Ambulatory Visit | Attending: Urology | Admitting: Urology

## 2020-07-26 ENCOUNTER — Ambulatory Visit (HOSPITAL_COMMUNITY)
Admission: RE | Admit: 2020-07-26 | Discharge: 2020-07-26 | Disposition: A | Discharge status: Home / Self Care | Payer: Medicare PPO | Source: Ambulatory Visit | Attending: Urology | Admitting: Urology

## 2020-07-26 ENCOUNTER — Encounter (HOSPITAL_COMMUNITY): Payer: Self-pay | Admitting: Urology

## 2020-07-26 ENCOUNTER — Encounter (HOSPITAL_COMMUNITY)
Admission: RE | Disposition: A | Discharge status: Home / Self Care | Payer: Self-pay | Source: Ambulatory Visit | Attending: Urology

## 2020-07-26 ENCOUNTER — Ambulatory Visit (HOSPITAL_COMMUNITY): Payer: Medicare PPO | Admitting: Physician Assistant

## 2020-07-26 ENCOUNTER — Ambulatory Visit (HOSPITAL_COMMUNITY): Payer: Medicare PPO | Admitting: Anesthesiology

## 2020-07-26 DIAGNOSIS — E119 Type 2 diabetes mellitus without complications: Secondary | ICD-10-CM | POA: Insufficient documentation

## 2020-07-26 DIAGNOSIS — R972 Elevated prostate specific antigen [PSA]: Secondary | ICD-10-CM | POA: Diagnosis not present

## 2020-07-26 DIAGNOSIS — N402 Nodular prostate without lower urinary tract symptoms: Secondary | ICD-10-CM

## 2020-07-26 DIAGNOSIS — J45909 Unspecified asthma, uncomplicated: Secondary | ICD-10-CM | POA: Insufficient documentation

## 2020-07-26 DIAGNOSIS — N3289 Other specified disorders of bladder: Secondary | ICD-10-CM | POA: Diagnosis not present

## 2020-07-26 DIAGNOSIS — I10 Essential (primary) hypertension: Secondary | ICD-10-CM | POA: Insufficient documentation

## 2020-07-26 DIAGNOSIS — K219 Gastro-esophageal reflux disease without esophagitis: Secondary | ICD-10-CM | POA: Diagnosis not present

## 2020-07-26 DIAGNOSIS — N210 Calculus in bladder: Secondary | ICD-10-CM | POA: Diagnosis present

## 2020-07-26 DIAGNOSIS — C61 Malignant neoplasm of prostate: Secondary | ICD-10-CM | POA: Diagnosis not present

## 2020-07-26 DIAGNOSIS — Z79899 Other long term (current) drug therapy: Secondary | ICD-10-CM | POA: Diagnosis not present

## 2020-07-26 HISTORY — PX: PROSTATE BIOPSY: SHX241

## 2020-07-26 HISTORY — PX: CYSTOSCOPY WITH LITHOLAPAXY: SHX1425

## 2020-07-26 SURGERY — BIOPSY, PROSTATE, RECTAL APPROACH, WITH US GUIDANCE
Anesthesia: General

## 2020-07-26 MED ORDER — ORAL CARE MOUTH RINSE: 15.0000 mL | Freq: Once | OROMUCOSAL | Status: AC

## 2020-07-26 MED ORDER — ONDANSETRON HCL 4 MG/2ML IJ SOLN
INTRAMUSCULAR | Status: AC
  Filled 2020-07-26: qty 2

## 2020-07-26 MED ORDER — ONDANSETRON HCL 4 MG/2ML IJ SOLN
INTRAMUSCULAR | Status: DC | PRN
  Administered 2020-07-26: 4 mg via INTRAVENOUS

## 2020-07-26 MED ORDER — FENTANYL CITRATE (PF) 100 MCG/2ML IJ SOLN
INTRAMUSCULAR | Status: DC | PRN
  Administered 2020-07-26 (×4): 25 ug via INTRAVENOUS

## 2020-07-26 MED ORDER — APREPITANT 40 MG PO CAPS
40.0000 mg | ORAL_CAPSULE | Freq: Once | ORAL | Status: AC
  Administered 2020-07-26: 40 mg via ORAL
  Filled 2020-07-26: qty 1

## 2020-07-26 MED ORDER — PROPOFOL 10 MG/ML IV BOLUS
INTRAVENOUS | Status: AC
  Filled 2020-07-26: qty 20

## 2020-07-26 MED ORDER — HYDROMORPHONE HCL 1 MG/ML IJ SOLN
0.5000 mg | INTRAMUSCULAR | Status: DC | PRN
Start: 2020-07-26 — End: 2020-07-28

## 2020-07-26 MED ORDER — BISACODYL 10 MG RE SUPP: 10.0000 mg | Freq: Every day | RECTAL | Status: DC | PRN

## 2020-07-26 MED ORDER — SODIUM CHLORIDE 0.9 % IV SOLN
2.0000 g | INTRAVENOUS | Status: AC
  Administered 2020-07-26: 2 g via INTRAVENOUS
  Filled 2020-07-26: qty 20

## 2020-07-26 MED ORDER — FENTANYL CITRATE (PF) 100 MCG/2ML IJ SOLN
INTRAMUSCULAR | Status: AC
  Filled 2020-07-26: qty 2

## 2020-07-26 MED ORDER — DEXAMETHASONE SODIUM PHOSPHATE 10 MG/ML IJ SOLN
INTRAMUSCULAR | Status: DC | PRN
  Administered 2020-07-26: 4 mg via INTRAVENOUS

## 2020-07-26 MED ORDER — SODIUM CHLORIDE 0.9 % IR SOLN
3000.0000 mL | Status: DC
  Administered 2020-07-26 – 2020-07-27 (×5): 3000 mL

## 2020-07-26 MED ORDER — FLEET ENEMA 7-19 GM/118ML RE ENEM: 1.0000 | ENEMA | Freq: Once | RECTAL | Status: DC | PRN

## 2020-07-26 MED ORDER — OXYCODONE HCL 5 MG PO TABS
5.0000 mg | ORAL_TABLET | Freq: Once | ORAL | Status: AC | PRN
  Administered 2020-07-26: 5 mg via ORAL

## 2020-07-26 MED ORDER — PROPOFOL 10 MG/ML IV BOLUS
INTRAVENOUS | Status: DC | PRN
  Administered 2020-07-26: 70 mg via INTRAVENOUS

## 2020-07-26 MED ORDER — DEXAMETHASONE SODIUM PHOSPHATE 10 MG/ML IJ SOLN
INTRAMUSCULAR | Status: AC
  Filled 2020-07-26: qty 1

## 2020-07-26 MED ORDER — ATORVASTATIN CALCIUM 10 MG PO TABS
10.0000 mg | ORAL_TABLET | Freq: Every evening | ORAL | Status: DC
  Administered 2020-07-26 – 2020-07-27 (×2): 10 mg via ORAL
  Filled 2020-07-26 (×2): qty 1

## 2020-07-26 MED ORDER — PANTOPRAZOLE SODIUM 40 MG PO TBEC
40.0000 mg | DELAYED_RELEASE_TABLET | Freq: Every day | ORAL | Status: DC
  Administered 2020-07-27 – 2020-07-28 (×2): 40 mg via ORAL
  Filled 2020-07-26 (×2): qty 1

## 2020-07-26 MED ORDER — POTASSIUM CHLORIDE IN NACL 20-0.45 MEQ/L-% IV SOLN
INTRAVENOUS | Status: DC
  Filled 2020-07-26 (×5): qty 1000

## 2020-07-26 MED ORDER — CHLORHEXIDINE GLUCONATE 0.12 % MT SOLN
15.0000 mL | Freq: Once | OROMUCOSAL | Status: AC
  Administered 2020-07-26: 15 mL via OROMUCOSAL

## 2020-07-26 MED ORDER — ALLOPURINOL 300 MG PO TABS
300.0000 mg | ORAL_TABLET | Freq: Every day | ORAL | Status: DC
  Administered 2020-07-26 – 2020-07-28 (×3): 300 mg via ORAL
  Filled 2020-07-26 (×3): qty 1

## 2020-07-26 MED ORDER — LIDOCAINE 2% (20 MG/ML) 5 ML SYRINGE
INTRAMUSCULAR | Status: DC | PRN
  Administered 2020-07-26: 40 mg via INTRAVENOUS

## 2020-07-26 MED ORDER — FENTANYL CITRATE (PF) 100 MCG/2ML IJ SOLN
25.0000 ug | INTRAMUSCULAR | Status: DC | PRN
  Administered 2020-07-26 (×2): 25 ug via INTRAVENOUS

## 2020-07-26 MED ORDER — OXYCODONE HCL 5 MG PO TABS: 5.0000 mg | ORAL_TABLET | ORAL | Status: DC | PRN

## 2020-07-26 MED ORDER — ACETAMINOPHEN 325 MG PO TABS
650.0000 mg | ORAL_TABLET | ORAL | Status: DC | PRN
  Administered 2020-07-26 – 2020-07-27 (×2): 650 mg via ORAL
  Filled 2020-07-26 (×2): qty 2

## 2020-07-26 MED ORDER — ONDANSETRON HCL 4 MG/2ML IJ SOLN: 4.0000 mg | INTRAMUSCULAR | Status: DC | PRN

## 2020-07-26 MED ORDER — ONDANSETRON HCL 4 MG/2ML IJ SOLN: 4.0000 mg | Freq: Once | INTRAMUSCULAR | Status: DC | PRN

## 2020-07-26 MED ORDER — LIDOCAINE 2% (20 MG/ML) 5 ML SYRINGE
INTRAMUSCULAR | Status: AC
  Filled 2020-07-26: qty 5

## 2020-07-26 MED ORDER — SENNOSIDES-DOCUSATE SODIUM 8.6-50 MG PO TABS: 1.0000 | ORAL_TABLET | Freq: Every evening | ORAL | Status: DC | PRN

## 2020-07-26 MED ORDER — PHENYLEPHRINE 40 MCG/ML (10ML) SYRINGE FOR IV PUSH (FOR BLOOD PRESSURE SUPPORT)
PREFILLED_SYRINGE | INTRAVENOUS | Status: DC | PRN
  Administered 2020-07-26: 20 ug via INTRAVENOUS
  Administered 2020-07-26: 40 ug via INTRAVENOUS

## 2020-07-26 MED ORDER — SODIUM CHLORIDE 0.9 % IR SOLN
3000.0000 mL | Status: DC
  Administered 2020-07-26: 3000 mL

## 2020-07-26 MED ORDER — LACTATED RINGERS IV SOLN
INTRAVENOUS | Status: DC
  Administered 2020-07-26: 1000 mL via INTRAVENOUS

## 2020-07-26 MED ORDER — OXYCODONE HCL 5 MG/5ML PO SOLN
5.0000 mg | Freq: Once | ORAL | Status: AC | PRN
Start: 2020-07-26 — End: 2020-07-26

## 2020-07-26 MED ORDER — TAMSULOSIN HCL 0.4 MG PO CAPS
0.4000 mg | ORAL_CAPSULE | Freq: Every day | ORAL | Status: DC
  Administered 2020-07-26 – 2020-07-28 (×3): 0.4 mg via ORAL
  Filled 2020-07-26 (×3): qty 1

## 2020-07-26 MED ORDER — OXYCODONE HCL 5 MG PO TABS
ORAL_TABLET | ORAL | Status: AC
  Filled 2020-07-26: qty 1

## 2020-07-26 MED ORDER — 0.9 % SODIUM CHLORIDE (POUR BTL) OPTIME
TOPICAL | Status: DC | PRN
  Administered 2020-07-26: 1000 mL

## 2020-07-26 MED ORDER — SODIUM CHLORIDE 0.9 % IR SOLN
Status: DC | PRN
  Administered 2020-07-26: 9000 mL

## 2020-07-26 MED ORDER — CHLORHEXIDINE GLUCONATE CLOTH 2 % EX PADS
6.0000 | MEDICATED_PAD | Freq: Every day | CUTANEOUS | Status: DC
  Administered 2020-07-27: 6 via TOPICAL

## 2020-07-26 SURGICAL SUPPLY — 29 items
BAG URO CATCHER STRL LF (MISCELLANEOUS) ×2 IMPLANT
CATH FOLEY 3WAY 30CC 22FR (CATHETERS) ×2 IMPLANT
CATH URET 5FR 28IN OPEN ENDED (CATHETERS) IMPLANT
CLOTH BEACON ORANGE TIMEOUT ST (SAFETY) ×2 IMPLANT
COVER WAND RF STERILE (DRAPES) IMPLANT
DRSG TELFA 3X8 NADH (GAUZE/BANDAGES/DRESSINGS) IMPLANT
GLOVE SURG POLYISO LF SZ8 (GLOVE) IMPLANT
GOWN STRL REUS W/TWL XL LVL3 (GOWN DISPOSABLE) ×2 IMPLANT
INST BIOPSY MAXCORE 18GX25 (NEEDLE) IMPLANT
INSTR BIOPSY MAXCORE 18GX20 (NEEDLE) ×2 IMPLANT
KIT TURNOVER KIT A (KITS) ×2 IMPLANT
LASER FIB FLEXIVA PULSE ID 550 (Laser) IMPLANT
LASER FIB FLEXIVA PULSE ID 910 (Laser) ×2 IMPLANT
LOOP CUT BIPOLAR 24F LRG (ELECTROSURGICAL) ×2 IMPLANT
MANIFOLD NEPTUNE II (INSTRUMENTS) ×2 IMPLANT
NDL SAFETY ECLIPSE 18X1.5 (NEEDLE) IMPLANT
NEEDLE HYPO 18GX1.5 SHARP (NEEDLE)
NEEDLE SPNL 22GX7 QUINCKE BK (NEEDLE) IMPLANT
PACK CYSTO (CUSTOM PROCEDURE TRAY) ×2 IMPLANT
PENCIL SMOKE EVACUATOR (MISCELLANEOUS) IMPLANT
PLUG CATH AND CAP STER (CATHETERS) ×2 IMPLANT
SURGILUBE 2OZ TUBE FLIPTOP (MISCELLANEOUS) ×2 IMPLANT
SYR CONTROL 10ML LL (SYRINGE) IMPLANT
SYR TOOMEY IRRIG 70ML (MISCELLANEOUS)
SYRINGE TOOMEY IRRIG 70ML (MISCELLANEOUS) IMPLANT
TOWEL OR 17X26 10 PK STRL BLUE (TOWEL DISPOSABLE) IMPLANT
TUBING CONNECTING 10 (TUBING) ×2 IMPLANT
TUBING UROLOGY SET (TUBING) IMPLANT
UNDERPAD 30X36 HEAVY ABSORB (UNDERPADS AND DIAPERS) ×2 IMPLANT

## 2020-07-26 NOTE — Anesthesia Procedure Notes (Signed)
Procedure Name: LMA Insertion Date/Time: 07/26/2020 1:00 PM Performed by: Eben Burow, CRNA Pre-anesthesia Checklist: Patient identified, Emergency Drugs available, Suction available, Patient being monitored and Timeout performed Patient Re-evaluated:Patient Re-evaluated prior to induction Oxygen Delivery Method: Circle system utilized Preoxygenation: Pre-oxygenation with 100% oxygen Induction Type: IV induction Ventilation: Mask ventilation without difficulty LMA: LMA inserted LMA Size: 4.0 Number of attempts: 1 Tube secured with: Tape Dental Injury: Teeth and Oropharynx as per pre-operative assessment

## 2020-07-26 NOTE — Progress Notes (Signed)
   07/26/20 1821  Vitals  Temp 97.9 F (36.6 C)  Temp Source Oral  BP (!) 165/70  MAP (mmHg) 96  BP Location Right Arm  BP Method Automatic  Patient Position (if appropriate) Lying  Pulse Rate 74  Pulse Rate Source Monitor  Resp 16  MEWS COLOR  MEWS Score Color Green  Oxygen Therapy  SpO2 100 %  O2 Device Room Air  Patient Activity (if Appropriate) In bed  Pulse Oximetry Type Continuous  Pain Assessment  Pain Score 0  Arrived to room 1443. Alert and oriented x 4. With continuous bladder irrigation going.  Light pink tinged urine noted, RR even and unlabored. Denies any pain, placed on telemetry monitoring. Oriented to room and call bell. Friend at bedside. Dinner order placed.Will continue to monitor.

## 2020-07-26 NOTE — Transfer of Care (Signed)
Immediate Anesthesia Transfer of Care Note  Patient: Devon Davis  Procedure(s) Performed: BIOPSY TRANSRECTAL ULTRASONIC PROSTATE (TUBP) (N/A ) CYSTOSCOPY WITH LITHOLAPAXY AND FULGERATION (N/A )  Patient Location: PACU  Anesthesia Type:General  Level of Consciousness: awake, alert  and patient cooperative  Airway & Oxygen Therapy: Patient Spontanous Breathing and Patient connected to face mask oxygen  Post-op Assessment: Report given to RN and Post -op Vital signs reviewed and stable  Post vital signs: Reviewed and stable  Last Vitals:  Vitals Value Taken Time  BP 173/69 07/26/20 1410  Temp    Pulse 71 07/26/20 1411  Resp 12 07/26/20 1411  SpO2 100 % 07/26/20 1411  Vitals shown include unvalidated device data.  Last Pain:  Vitals:   07/26/20 1014  TempSrc:   PainSc: 0-No pain      Patients Stated Pain Goal: 3 (22/97/98 9211)  Complications: No complications documented.

## 2020-07-26 NOTE — Anesthesia Preprocedure Evaluation (Addendum)
Anesthesia Evaluation  Patient identified by MRN, date of birth, ID band Patient awake    Reviewed: Allergy & Precautions, NPO status , Patient's Chart, lab work & pertinent test results  History of Anesthesia Complications Negative for: history of anesthetic complications  Airway Mallampati: IV  TM Distance: >3 FB Neck ROM: Full    Dental  (+) Dental Advisory Given, Teeth Intact   Pulmonary neg pulmonary ROS,    Pulmonary exam normal        Cardiovascular hypertension (no meds), Normal cardiovascular exam     Neuro/Psych  Hard of hearing  CVA (balance issues), Residual Symptoms negative psych ROS   GI/Hepatic Neg liver ROS, GERD  Medicated and Controlled,  Endo/Other  negative endocrine ROS  Renal/GU negative Renal ROS     Musculoskeletal  (+) Arthritis ,   Abdominal   Peds  Hematology  On plavix    Anesthesia Other Findings Covid test negative   Reproductive/Obstetrics                            Anesthesia Physical Anesthesia Plan  ASA: III  Anesthesia Plan: General   Post-op Pain Management:    Induction: Intravenous  PONV Risk Score and Plan: 2 and Treatment may vary due to age or medical condition, Ondansetron and Aprepitant  Airway Management Planned: LMA  Additional Equipment: None  Intra-op Plan:   Post-operative Plan: Extubation in OR  Informed Consent: I have reviewed the patients History and Physical, chart, labs and discussed the procedure including the risks, benefits and alternatives for the proposed anesthesia with the patient or authorized representative who has indicated his/her understanding and acceptance.     Dental advisory given  Plan Discussed with: CRNA and Anesthesiologist  Anesthesia Plan Comments:        Anesthesia Quick Evaluation

## 2020-07-26 NOTE — Anesthesia Postprocedure Evaluation (Signed)
Anesthesia Post Note  Patient: Devon Davis  Procedure(s) Performed: BIOPSY TRANSRECTAL ULTRASONIC PROSTATE (TUBP) (N/A ) CYSTOSCOPY WITH LITHOLAPAXY AND FULGERATION (N/A )     Patient location during evaluation: PACU Anesthesia Type: General Level of consciousness: awake and alert, patient cooperative and oriented Pain management: pain level controlled Vital Signs Assessment: post-procedure vital signs reviewed and stable Respiratory status: spontaneous breathing, nonlabored ventilation and respiratory function stable Cardiovascular status: blood pressure returned to baseline and stable Postop Assessment: no apparent nausea or vomiting Anesthetic complications: no   No complications documented.  Last Vitals:  Vitals:   07/26/20 1600 07/26/20 1700  BP: (!) 169/81 (!) 166/78  Pulse: 68 72  Resp: (!) 9 14  Temp: 36.7 C (!) 36.4 C  SpO2: 96% 93%    Last Pain:  Vitals:   07/26/20 1600  TempSrc:   PainSc: 3                  Micajah Dennin,E. Mose Colaizzi

## 2020-07-26 NOTE — Interval H&P Note (Signed)
History and Physical Interval Note:  07/26/2020 12:34 PM  Devon Davis  has presented today for surgery, with the diagnosis of [name].  The various methods of treatment have been discussed with the patient and family. After consideration of risks, benefits and other options for treatment, the patient has consented to  Procedure(s): BIOPSY TRANSRECTAL ULTRASONIC PROSTATE (TUBP) (N/A) CYSTOSCOPY WITH LITHOLAPAXY (N/A) as a surgical intervention.  The patient's history has been reviewed, patient examined, no change in status, stable for surgery.  I have reviewed the patient's chart and labs.  Questions were answered to the patient's satisfaction.     Irine Seal

## 2020-07-26 NOTE — Discharge Instructions (Signed)

## 2020-07-26 NOTE — Interval H&P Note (Signed)
History and Physical Interval Note:  07/26/2020 12:30 PM  Devon Davis  has presented today for surgery, with the diagnosis of [place].  The various methods of treatment have been discussed with the patient and family. After consideration of risks, benefits and other options for treatment, the patient has consented to  Procedure(s): BIOPSY TRANSRECTAL ULTRASONIC PROSTATE (TUBP) (N/A) CYSTOSCOPY WITH LITHOLAPAXY (N/A) as a surgical intervention.  The patient's history has been reviewed, patient examined, no change in status, stable for surgery.  I have reviewed the patient's chart and labs.  Questions were answered to the patient's satisfaction.     Irine Seal

## 2020-07-26 NOTE — Op Note (Signed)
Procedure: 1.  Transrectal ultrasound of the prostate. 2.  Ultrasound-guided prostate needle biopsy. 3.  Cystoscopy litholapaxy of greater than 2.5 cm stone burden. 4.  Cystoscopy with fulguration of the bladder neck.  Preoperative diagnosis: 1.  Bladder stones. 2.  Nodular prostate with elevated PSA of 63.  Postop diagnosis: Same with bladder neck bleeding from instrumentation because of his large prostate.  Surgeon: Dr. Irine Seal.  Anesthesia: General.  Specimen: 1.  6 core needle prostate biopsy. 2.  Multiple bladder stones with cumulative diameter greater than 2.5 cm.  Drain: 40 [place] three-way Foley catheter.  EBL: 20 mL.  Complications: None.  Indications: The patient is a 87 year old male who originally presented with recurrent urinary tract infections and overactive bladder.  He was found on evaluation to have multiple bladder stones with a cumulative diameter of greater than 2.5 cm and a nodular prostate with a PSA of 63 concerning for locally advanced prostate cancer.  Procedure: He was taken operating room where he was given 2 g of Rocephin and gentamicin per pharmacy dosing.  A general anesthetic was induced.  He was placed in lithotomy position and fitted with PAS hose.  The 10 MHz transrectal ultrasound probe was assembled and inserted and scanning was performed.  He was noted to have a 137 mL prostate with a large transitional zone with some bilateral hypoechogenicity of the apical transitional zone.  There was a 1 cm nodule in the peripheral zone at the right base with extension adjacent to the seminal vesicles that is worrisome for extracapsular extension of prostate cancer.  The left peripheral zone at the base and mid prostate was somewhat hypoechoic, also concerning for cancer.  No stones were seen.  After the diagnostic scan, a sextant needle biopsy was performed with cores from the right base through the nodule, the right mid prostate and the right apical  prostate.  The probe was then reversed and cores were obtained from the left base, the left mid prostate and the left apical prostate.  It was felt that 6 course was adequate for our purposes today.  After completion of the biopsy the patient was prepped with Betadine solution and draped in the usual sterile fashion.  The greenlight laser scope was then fitted with the visual obturator and the 30 degree lens and the scope was passed transurethrally.  Inspection demonstrated a normal urethra.  The external sphincter was intact.  The prostatic urethra was 5 to 6 cm in length with bilobar hyperplasia but not a large middle lobe.  Multiple ovoid tan calculi were noted at the base the bladder and appeared to measure from 6 mm to 12 mm in diameter but the cumulative diameter because there were at least 10 stones with greater than 25 mm.  The greenlight scope was then exchanged for a 26 French continuous-flow resectoscope sheath which was fitted with the greenlight bridge.  The 30 degree lens in the 1000 m Moses laser fiber were then used.  The laser was initially set on point 2 J and 70 Hz on the left pedal and 1 J and 15 Hz on the right pedal with the power eventually being increased to 2 J on the right pedal.  Several of the smaller stones were evacuated through the resectoscope intact and then the remaining larger stones were fragmented with the laser.  The fragments were then removed.  Once the cystolitholapaxy had been completed the greenlight bridge was replaced with an [place] with a bipolar loop.  Saline was used as the irrigant.  There was significant bleeding circumferentially in the proximal prostate and bladder neck from the instrumentation and this was managed with generous fulguration using the TUR loop.  The area of fulguration was approximately 1.5 x 3 cm.  Final inspection revealed no retained stone fragments and no active bleeding.  The resectoscope was removed and a 46 [place]  three-way Foley catheter was inserted with the aid of a catheter guide.  The balloon was filled with 30 mL of sterile fluid.  The bladder was irrigated with clear return.  The irrigation port was plugged with a catheter plug and the catheter was placed to straight drainage.  He was taken down from lithotomy position, his anesthetic was reversed and he was moved recovery in stable condition.  There were no complications.  Some of the stone fragments will be sent for analysis.

## 2020-07-27 DIAGNOSIS — I10 Essential (primary) hypertension: Secondary | ICD-10-CM | POA: Diagnosis not present

## 2020-07-27 DIAGNOSIS — J45909 Unspecified asthma, uncomplicated: Secondary | ICD-10-CM | POA: Diagnosis not present

## 2020-07-27 DIAGNOSIS — C61 Malignant neoplasm of prostate: Secondary | ICD-10-CM | POA: Diagnosis not present

## 2020-07-27 DIAGNOSIS — Z79899 Other long term (current) drug therapy: Secondary | ICD-10-CM | POA: Diagnosis not present

## 2020-07-27 DIAGNOSIS — N210 Calculus in bladder: Secondary | ICD-10-CM | POA: Diagnosis not present

## 2020-07-27 DIAGNOSIS — E119 Type 2 diabetes mellitus without complications: Secondary | ICD-10-CM | POA: Diagnosis not present

## 2020-07-27 NOTE — Plan of Care (Signed)

## 2020-07-27 NOTE — Progress Notes (Signed)
1 Day Post-Op Subjective: Denies pain.  No nausea or emesis.  Tolerating regular diet.  Tolerating Foley catheter.  Afebrile overnight.  Objective: Vital signs in last 24 hours: Temp:  [97.5 F (36.4 C)-98.4 F (36.9 C)] 97.8 F (36.6 C) (05/07 0447) Pulse Rate:  [62-86] 69 (05/07 0447) Resp:  [9-22] 22 (05/07 0447) BP: (129-177)/(61-82) 134/65 (05/07 0447) SpO2:  [93 %-100 %] 97 % (05/07 0447) Weight:  [89.8 kg] 89.8 kg (05/06 1014)  Intake/Output from previous day: 05/06 0701 - 05/07 0700 In: 8559 [I.V.:1748; IV Piggyback:211.1] Out: 81157 [Urine:16150] Intake/Output this shift: No intake/output data recorded.   UOP: 13.7 (CBI)  Physical Exam:  General: Alert and oriented CV: RRR Lungs: Clear Abdomen: Soft, ND, NT Ext: NT, No erythema  Lab Results: No results for input(s): HGB, HCT in the last 72 hours. BMET No results for input(s): NA, K, CL, CO2, GLUCOSE, BUN, CREATININE, CALCIUM in the last 72 hours.   Studies/Results: [place] Intraoperative  Result Date: 07/26/2020 CLINICAL DATA:  Ultrasound was provided for use by the ordering physician.  No provider Interpretation or professional fees incurred.     Assessment/Plan: 1. BPH, bladder stone: S/p TRUS biopsy, cystolitholopaxy, fulguration of bladder neck on 07/27/2020  -CBI clamped this morning with return of light pink urine.  Irrigated out 5 tiny clots.  Urine remains light pink.  Keep clamped.  Will reevaluate later this morning. -We will void trial once urine is clear. -Low threshold to keep in house given hematuria and that he lives alone. -Plan to observe today.   LOS: 0 days   Matt R. Haileyann Staiger MD 07/27/2020, 9:09 AM [facility]  Pager: [phone]

## 2020-07-27 NOTE — Progress Notes (Signed)
Urine dark pink with CBI clamped. Irrigates without clots. Restarted CBI. Keep in house today. NPO after midnight.  Matt R. [facility]  Pager: [phone]

## 2020-07-28 ENCOUNTER — Other Ambulatory Visit: Payer: Self-pay

## 2020-07-28 DIAGNOSIS — N210 Calculus in bladder: Secondary | ICD-10-CM | POA: Diagnosis not present

## 2020-07-28 DIAGNOSIS — I10 Essential (primary) hypertension: Secondary | ICD-10-CM | POA: Diagnosis not present

## 2020-07-28 DIAGNOSIS — E119 Type 2 diabetes mellitus without complications: Secondary | ICD-10-CM | POA: Diagnosis not present

## 2020-07-28 DIAGNOSIS — Z79899 Other long term (current) drug therapy: Secondary | ICD-10-CM | POA: Diagnosis not present

## 2020-07-28 DIAGNOSIS — C61 Malignant neoplasm of prostate: Secondary | ICD-10-CM | POA: Diagnosis not present

## 2020-07-28 DIAGNOSIS — J45909 Unspecified asthma, uncomplicated: Secondary | ICD-10-CM | POA: Diagnosis not present

## 2020-07-28 NOTE — Plan of Care (Signed)

## 2020-07-28 NOTE — Progress Notes (Signed)
2 Days Post-Op Subjective: Denies pain.  No nausea or emesis.  Tolerating regular diet.  Tolerating Foley catheter. Urine clear via catheter this AM. Afebrile overnight.  Objective: Vital signs in last 24 hours: Temp:  [98 F (36.7 C)-98.4 F (36.9 C)] 98.4 F (36.9 C) (05/08 0544) Pulse Rate:  [69-73] 72 (05/08 0544) Resp:  [18] 18 (05/08 0544) BP: (114-159)/(51-67) 159/67 (05/08 0544) SpO2:  [97 %-98 %] 97 % (05/08 0544)  Intake/Output from previous day: 05/07 0701 - 05/08 0700 In: 13220 [P.O.:240; I.V.:980] Out: 41324 [Urine:10875] Intake/Output this shift: No intake/output data recorded.  UOP 10L (CBI)  Physical Exam:  General: Alert and oriented CV: RRR Lungs: Clear Abdomen: Soft, ND, NT Ext: NT, No erythema  Lab Results: No results for input(s): HGB, HCT in the last 72 hours. BMET No results for input(s): NA, K, CL, CO2, GLUCOSE, BUN, CREATININE, CALCIUM in the last 72 hours.   Studies/Results: [place] Intraoperative  Result Date: 07/26/2020 CLINICAL DATA:  Ultrasound was provided for use by the ordering physician.  No provider Interpretation or professional fees incurred.     Assessment/Plan: 1. BPH, bladder stone: S/p TRUS biopsy, cystolitholopaxy, fulguration of bladder neck on 07/27/2020  -Urine clear on slow drip CBI. I irrigated a few old clots out with normal saline. CBI clamped with clear return. As long as remains clear, plan to start void trial at 10AM. Will plan for 3 voids ensuring adequate void trial with appropriate PVR ratio. -Low threshold to keep in house given social situation -Discussed with nursing staff, his friend, Dr. Lindalou Hose and will discuss with his caretaker, Wadie Lessen [phone])   LOS: 0 days   Matt R. Gwenetta Devos MD 07/28/2020, 9:32 AM [facility]  Pager: [phone]

## 2020-07-28 NOTE — Plan of Care (Signed)

## 2020-07-28 NOTE — Discharge Summary (Signed)
Date of admission: 07/26/2020  Date of discharge: 07/28/2020  Admission diagnosis: Bladder stone, Elevated PSA, BPH  Discharge diagnosis: Bladder stone, Elevated PSA, BPH  Secondary diagnoses: None  History and Physical: For full details, please see admission history and physical. Briefly, Devon Davis is a 87 y.o. year old patient with prostate nodule, bladder stone, BPH and elevated PSA who underwent cystolitholapaxy, TRUS biopsy and fulguration of prostate.   Hospital Course: The patient recovered in the usual expected fashion.  He had his diet advanced slowly.  Initially managed with IV pain control, then transitioned to PO meds when he was tolerating oral intake.  His labs were stable throughout the hospital course.  He was discharged to home on POD#2 after passing a void trial with PVRs of 0.  At the time of discharge the patient was tolerating a regular diet, passing flatus, ambulating, had adequate pain control and was agreeable to discharge. I spoke with his caretaker, Mr. [name] who is watching him tonight. He knows to come back to [facility] ED if any issues. Follow up as scheduled.    Laboratory values: No results for input(s): HGB, HCT in the last 72 hours. No results for input(s): CREATININE in the last 72 hours.  Disposition: Home  Discharge instruction: The patient was instructed to be ambulatory but told to refrain from heavy lifting, strenuous activity, or driving.   Discharge medications:  Allergies as of 07/28/2020   No Known Allergies     Medication List    TAKE these medications   acetaminophen 500 MG tablet Commonly known as: TYLENOL Take 1,000 mg by mouth in the morning and at bedtime.   allopurinol 300 MG tablet Commonly known as: ZYLOPRIM Take 300 mg by mouth daily.   atorvastatin 10 MG tablet Commonly known as: LIPITOR Take 10 mg by mouth every evening.   CENTRUM SILVER 50+MEN PO Take 1 tablet by mouth daily.   clopidogrel 75 MG  tablet Commonly known as: PLAVIX Take 75 mg by mouth every evening.   pantoprazole 40 MG tablet Commonly known as: PROTONIX Take 40 mg by mouth daily.   silodosin 8 MG Caps capsule Commonly known as: RAPAFLO Take 8 mg by mouth daily with breakfast.       Followup:   Follow-up Information    Irine Seal, MD On 08/07/2020.   Specialty: Urology Why: Nils Flack information: [place] [address] [phone]               [facility]  Pager: [phone]

## 2020-07-29 LAB — SURGICAL PATHOLOGY

## 2020-08-02 ENCOUNTER — Encounter (HOSPITAL_COMMUNITY): Payer: Self-pay | Admitting: Urology

## 2020-08-02 DIAGNOSIS — N138 Other obstructive and reflux uropathy: Secondary | ICD-10-CM | POA: Diagnosis not present

## 2020-08-02 DIAGNOSIS — N320 Bladder-neck obstruction: Secondary | ICD-10-CM | POA: Diagnosis not present

## 2020-08-02 DIAGNOSIS — N400 Enlarged prostate without lower urinary tract symptoms: Secondary | ICD-10-CM | POA: Diagnosis not present

## 2020-08-02 DIAGNOSIS — N403 Nodular prostate with lower urinary tract symptoms: Secondary | ICD-10-CM | POA: Diagnosis not present

## 2020-08-02 DIAGNOSIS — R590 Localized enlarged lymph nodes: Secondary | ICD-10-CM | POA: Diagnosis not present

## 2020-08-02 DIAGNOSIS — N210 Calculus in bladder: Secondary | ICD-10-CM | POA: Diagnosis not present

## 2020-08-02 DIAGNOSIS — N200 Calculus of kidney: Secondary | ICD-10-CM | POA: Diagnosis not present

## 2020-08-02 DIAGNOSIS — Z87442 Personal history of urinary calculi: Secondary | ICD-10-CM | POA: Diagnosis not present

## 2020-08-05 DIAGNOSIS — M19012 Primary osteoarthritis, left shoulder: Secondary | ICD-10-CM | POA: Diagnosis not present

## 2020-08-05 DIAGNOSIS — R590 Localized enlarged lymph nodes: Secondary | ICD-10-CM | POA: Diagnosis not present

## 2020-08-05 DIAGNOSIS — M19031 Primary osteoarthritis, right wrist: Secondary | ICD-10-CM | POA: Diagnosis not present

## 2020-08-05 DIAGNOSIS — M19071 Primary osteoarthritis, right ankle and foot: Secondary | ICD-10-CM | POA: Diagnosis not present

## 2020-08-05 DIAGNOSIS — M19011 Primary osteoarthritis, right shoulder: Secondary | ICD-10-CM | POA: Diagnosis not present

## 2020-08-05 DIAGNOSIS — M4856XD Collapsed vertebra, not elsewhere classified, lumbar region, subsequent encounter for fracture with routine healing: Secondary | ICD-10-CM | POA: Diagnosis not present

## 2020-08-05 DIAGNOSIS — C61 Malignant neoplasm of prostate: Secondary | ICD-10-CM | POA: Diagnosis not present

## 2020-08-05 DIAGNOSIS — M479 Spondylosis, unspecified: Secondary | ICD-10-CM | POA: Diagnosis not present

## 2020-08-05 DIAGNOSIS — M170 Bilateral primary osteoarthritis of knee: Secondary | ICD-10-CM | POA: Diagnosis not present

## 2020-08-05 DIAGNOSIS — M19032 Primary osteoarthritis, left wrist: Secondary | ICD-10-CM | POA: Diagnosis not present

## 2020-08-08 DIAGNOSIS — C61 Malignant neoplasm of prostate: Secondary | ICD-10-CM | POA: Diagnosis not present

## 2020-08-08 DIAGNOSIS — N210 Calculus in bladder: Secondary | ICD-10-CM | POA: Diagnosis not present

## 2020-08-12 DIAGNOSIS — C775 Secondary and unspecified malignant neoplasm of intrapelvic lymph nodes: Secondary | ICD-10-CM | POA: Diagnosis not present

## 2020-08-12 DIAGNOSIS — C778 Secondary and unspecified malignant neoplasm of lymph nodes of multiple regions: Secondary | ICD-10-CM | POA: Diagnosis not present

## 2020-08-12 DIAGNOSIS — N210 Calculus in bladder: Secondary | ICD-10-CM | POA: Diagnosis not present

## 2020-08-12 DIAGNOSIS — R351 Nocturia: Secondary | ICD-10-CM | POA: Diagnosis not present

## 2020-08-12 DIAGNOSIS — C61 Malignant neoplasm of prostate: Secondary | ICD-10-CM | POA: Diagnosis not present

## 2020-08-27 DIAGNOSIS — N210 Calculus in bladder: Secondary | ICD-10-CM | POA: Diagnosis not present

## 2020-08-27 DIAGNOSIS — C61 Malignant neoplasm of prostate: Secondary | ICD-10-CM | POA: Diagnosis not present

## 2020-08-30 DIAGNOSIS — E7849 Other hyperlipidemia: Secondary | ICD-10-CM | POA: Diagnosis not present

## 2020-08-30 DIAGNOSIS — I10 Essential (primary) hypertension: Secondary | ICD-10-CM | POA: Diagnosis not present

## 2020-08-30 DIAGNOSIS — N183 Chronic kidney disease, stage 3 unspecified: Secondary | ICD-10-CM | POA: Diagnosis not present

## 2020-08-30 DIAGNOSIS — K2100 Gastro-esophageal reflux disease with esophagitis, without bleeding: Secondary | ICD-10-CM | POA: Diagnosis not present

## 2020-08-30 DIAGNOSIS — E039 Hypothyroidism, unspecified: Secondary | ICD-10-CM | POA: Diagnosis not present

## 2020-08-30 DIAGNOSIS — E782 Mixed hyperlipidemia: Secondary | ICD-10-CM | POA: Diagnosis not present

## 2020-08-30 DIAGNOSIS — E1165 Type 2 diabetes mellitus with hyperglycemia: Secondary | ICD-10-CM | POA: Diagnosis not present

## 2020-09-04 DIAGNOSIS — J301 Allergic rhinitis due to pollen: Secondary | ICD-10-CM | POA: Diagnosis not present

## 2020-09-04 DIAGNOSIS — K432 Incisional hernia without obstruction or gangrene: Secondary | ICD-10-CM | POA: Diagnosis not present

## 2020-09-04 DIAGNOSIS — N400 Enlarged prostate without lower urinary tract symptoms: Secondary | ICD-10-CM | POA: Diagnosis not present

## 2020-09-04 DIAGNOSIS — J449 Chronic obstructive pulmonary disease, unspecified: Secondary | ICD-10-CM | POA: Diagnosis not present

## 2020-09-04 DIAGNOSIS — C61 Malignant neoplasm of prostate: Secondary | ICD-10-CM | POA: Diagnosis not present

## 2020-09-04 DIAGNOSIS — K2100 Gastro-esophageal reflux disease with esophagitis, without bleeding: Secondary | ICD-10-CM | POA: Diagnosis not present

## 2020-09-04 DIAGNOSIS — E782 Mixed hyperlipidemia: Secondary | ICD-10-CM | POA: Diagnosis not present

## 2020-09-04 DIAGNOSIS — E6609 Other obesity due to excess calories: Secondary | ICD-10-CM | POA: Diagnosis not present

## 2020-09-04 DIAGNOSIS — M545 Low back pain, unspecified: Secondary | ICD-10-CM | POA: Diagnosis not present

## 2020-09-04 DIAGNOSIS — I10 Essential (primary) hypertension: Secondary | ICD-10-CM | POA: Diagnosis not present

## 2020-09-04 DIAGNOSIS — E1165 Type 2 diabetes mellitus with hyperglycemia: Secondary | ICD-10-CM | POA: Diagnosis not present

## 2020-09-04 DIAGNOSIS — E7849 Other hyperlipidemia: Secondary | ICD-10-CM | POA: Diagnosis not present

## 2020-09-10 DIAGNOSIS — C61 Malignant neoplasm of prostate: Secondary | ICD-10-CM | POA: Diagnosis not present

## 2020-09-16 DIAGNOSIS — R3915 Urgency of urination: Secondary | ICD-10-CM | POA: Diagnosis not present

## 2020-09-16 DIAGNOSIS — C61 Malignant neoplasm of prostate: Secondary | ICD-10-CM | POA: Diagnosis not present

## 2020-09-16 DIAGNOSIS — C775 Secondary and unspecified malignant neoplasm of intrapelvic lymph nodes: Secondary | ICD-10-CM | POA: Diagnosis not present

## 2020-10-24 DIAGNOSIS — N200 Calculus of kidney: Secondary | ICD-10-CM | POA: Diagnosis not present

## 2020-10-24 DIAGNOSIS — D692 Other nonthrombocytopenic purpura: Secondary | ICD-10-CM | POA: Diagnosis not present

## 2020-10-24 DIAGNOSIS — E119 Type 2 diabetes mellitus without complications: Secondary | ICD-10-CM | POA: Diagnosis not present

## 2020-10-24 DIAGNOSIS — Z299 Encounter for prophylactic measures, unspecified: Secondary | ICD-10-CM | POA: Diagnosis not present

## 2020-10-24 DIAGNOSIS — I10 Essential (primary) hypertension: Secondary | ICD-10-CM | POA: Diagnosis not present

## 2020-11-01 DIAGNOSIS — C61 Malignant neoplasm of prostate: Secondary | ICD-10-CM | POA: Diagnosis not present

## 2020-11-01 DIAGNOSIS — M79641 Pain in right hand: Secondary | ICD-10-CM | POA: Diagnosis not present

## 2020-11-06 DIAGNOSIS — N403 Nodular prostate with lower urinary tract symptoms: Secondary | ICD-10-CM | POA: Diagnosis not present

## 2020-11-06 DIAGNOSIS — C775 Secondary and unspecified malignant neoplasm of intrapelvic lymph nodes: Secondary | ICD-10-CM | POA: Diagnosis not present

## 2020-11-06 DIAGNOSIS — C61 Malignant neoplasm of prostate: Secondary | ICD-10-CM | POA: Diagnosis not present

## 2020-11-06 DIAGNOSIS — R351 Nocturia: Secondary | ICD-10-CM | POA: Diagnosis not present

## 2020-12-13 DIAGNOSIS — I10 Essential (primary) hypertension: Secondary | ICD-10-CM | POA: Diagnosis not present

## 2020-12-13 DIAGNOSIS — Z1331 Encounter for screening for depression: Secondary | ICD-10-CM | POA: Diagnosis not present

## 2020-12-13 DIAGNOSIS — Z7189 Other specified counseling: Secondary | ICD-10-CM | POA: Diagnosis not present

## 2020-12-13 DIAGNOSIS — Z0000 Encounter for general adult medical examination without abnormal findings: Secondary | ICD-10-CM | POA: Diagnosis not present

## 2020-12-13 DIAGNOSIS — Z79899 Other long term (current) drug therapy: Secondary | ICD-10-CM | POA: Diagnosis not present

## 2020-12-13 DIAGNOSIS — E7800 Pure hypercholesterolemia, unspecified: Secondary | ICD-10-CM | POA: Diagnosis not present

## 2020-12-13 DIAGNOSIS — Z789 Other specified health status: Secondary | ICD-10-CM | POA: Diagnosis not present

## 2020-12-13 DIAGNOSIS — R5383 Other fatigue: Secondary | ICD-10-CM | POA: Diagnosis not present

## 2020-12-13 DIAGNOSIS — E669 Obesity, unspecified: Secondary | ICD-10-CM | POA: Diagnosis not present

## 2020-12-13 DIAGNOSIS — Z23 Encounter for immunization: Secondary | ICD-10-CM | POA: Diagnosis not present

## 2020-12-13 DIAGNOSIS — Z1339 Encounter for screening examination for other mental health and behavioral disorders: Secondary | ICD-10-CM | POA: Diagnosis not present

## 2020-12-13 DIAGNOSIS — Z6831 Body mass index (BMI) 31.0-31.9, adult: Secondary | ICD-10-CM | POA: Diagnosis not present

## 2021-02-04 DIAGNOSIS — M19049 Primary osteoarthritis, unspecified hand: Secondary | ICD-10-CM | POA: Diagnosis not present

## 2021-02-04 DIAGNOSIS — Z299 Encounter for prophylactic measures, unspecified: Secondary | ICD-10-CM | POA: Diagnosis not present

## 2021-02-04 DIAGNOSIS — R21 Rash and other nonspecific skin eruption: Secondary | ICD-10-CM | POA: Diagnosis not present

## 2021-02-04 DIAGNOSIS — D492 Neoplasm of unspecified behavior of bone, soft tissue, and skin: Secondary | ICD-10-CM | POA: Diagnosis not present

## 2021-02-04 DIAGNOSIS — I10 Essential (primary) hypertension: Secondary | ICD-10-CM | POA: Diagnosis not present

## 2021-02-10 DIAGNOSIS — Z1283 Encounter for screening for malignant neoplasm of skin: Secondary | ICD-10-CM | POA: Diagnosis not present

## 2021-02-10 DIAGNOSIS — D0439 Carcinoma in situ of skin of other parts of face: Secondary | ICD-10-CM | POA: Diagnosis not present

## 2021-02-10 DIAGNOSIS — D485 Neoplasm of uncertain behavior of skin: Secondary | ICD-10-CM | POA: Diagnosis not present

## 2021-02-10 DIAGNOSIS — Z85828 Personal history of other malignant neoplasm of skin: Secondary | ICD-10-CM | POA: Diagnosis not present

## 2021-02-21 DIAGNOSIS — E349 Endocrine disorder, unspecified: Secondary | ICD-10-CM | POA: Diagnosis not present

## 2021-02-21 DIAGNOSIS — Z125 Encounter for screening for malignant neoplasm of prostate: Secondary | ICD-10-CM | POA: Diagnosis not present

## 2021-02-21 DIAGNOSIS — N289 Disorder of kidney and ureter, unspecified: Secondary | ICD-10-CM | POA: Diagnosis not present

## 2021-02-26 DIAGNOSIS — C775 Secondary and unspecified malignant neoplasm of intrapelvic lymph nodes: Secondary | ICD-10-CM | POA: Diagnosis not present

## 2021-02-26 DIAGNOSIS — N320 Bladder-neck obstruction: Secondary | ICD-10-CM | POA: Diagnosis not present

## 2021-02-26 DIAGNOSIS — C61 Malignant neoplasm of prostate: Secondary | ICD-10-CM | POA: Diagnosis not present

## 2021-02-26 DIAGNOSIS — R972 Elevated prostate specific antigen [PSA]: Secondary | ICD-10-CM | POA: Diagnosis not present

## 2021-02-27 DIAGNOSIS — C44329 Squamous cell carcinoma of skin of other parts of face: Secondary | ICD-10-CM | POA: Diagnosis not present

## 2021-03-06 DIAGNOSIS — H903 Sensorineural hearing loss, bilateral: Secondary | ICD-10-CM | POA: Diagnosis not present

## 2021-04-08 DIAGNOSIS — H903 Sensorineural hearing loss, bilateral: Secondary | ICD-10-CM | POA: Diagnosis not present

## 2021-04-28 DIAGNOSIS — H18413 Arcus senilis, bilateral: Secondary | ICD-10-CM | POA: Diagnosis not present

## 2021-04-28 DIAGNOSIS — H35363 Drusen (degenerative) of macula, bilateral: Secondary | ICD-10-CM | POA: Diagnosis not present

## 2021-04-28 DIAGNOSIS — H524 Presbyopia: Secondary | ICD-10-CM | POA: Diagnosis not present

## 2021-04-28 DIAGNOSIS — H5203 Hypermetropia, bilateral: Secondary | ICD-10-CM | POA: Diagnosis not present

## 2021-04-28 DIAGNOSIS — H52223 Regular astigmatism, bilateral: Secondary | ICD-10-CM | POA: Diagnosis not present

## 2021-04-28 DIAGNOSIS — Z961 Presence of intraocular lens: Secondary | ICD-10-CM | POA: Diagnosis not present

## 2021-05-19 DIAGNOSIS — U071 COVID-19: Secondary | ICD-10-CM | POA: Diagnosis not present

## 2021-05-30 DIAGNOSIS — C61 Malignant neoplasm of prostate: Secondary | ICD-10-CM | POA: Diagnosis not present

## 2021-06-04 DIAGNOSIS — R972 Elevated prostate specific antigen [PSA]: Secondary | ICD-10-CM | POA: Diagnosis not present

## 2021-06-04 DIAGNOSIS — R3915 Urgency of urination: Secondary | ICD-10-CM | POA: Diagnosis not present

## 2021-06-04 DIAGNOSIS — R351 Nocturia: Secondary | ICD-10-CM | POA: Diagnosis not present

## 2021-06-04 DIAGNOSIS — C61 Malignant neoplasm of prostate: Secondary | ICD-10-CM | POA: Diagnosis not present

## 2021-06-04 DIAGNOSIS — N403 Nodular prostate with lower urinary tract symptoms: Secondary | ICD-10-CM | POA: Diagnosis not present

## 2021-06-09 DIAGNOSIS — I10 Essential (primary) hypertension: Secondary | ICD-10-CM | POA: Diagnosis not present

## 2021-06-09 DIAGNOSIS — H6982 Other specified disorders of Eustachian tube, left ear: Secondary | ICD-10-CM | POA: Diagnosis not present

## 2021-06-09 DIAGNOSIS — H9319 Tinnitus, unspecified ear: Secondary | ICD-10-CM | POA: Diagnosis not present

## 2021-06-09 DIAGNOSIS — Z299 Encounter for prophylactic measures, unspecified: Secondary | ICD-10-CM | POA: Diagnosis not present

## 2021-06-17 DIAGNOSIS — U071 COVID-19: Secondary | ICD-10-CM | POA: Diagnosis not present

## 2021-06-17 DIAGNOSIS — R531 Weakness: Secondary | ICD-10-CM | POA: Diagnosis not present

## 2021-06-17 DIAGNOSIS — Z7902 Long term (current) use of antithrombotics/antiplatelets: Secondary | ICD-10-CM | POA: Diagnosis not present

## 2021-06-17 DIAGNOSIS — C61 Malignant neoplasm of prostate: Secondary | ICD-10-CM | POA: Diagnosis not present

## 2021-08-21 DIAGNOSIS — G5601 Carpal tunnel syndrome, right upper limb: Secondary | ICD-10-CM | POA: Diagnosis not present

## 2021-09-11 DIAGNOSIS — C61 Malignant neoplasm of prostate: Secondary | ICD-10-CM | POA: Diagnosis not present

## 2021-09-17 DIAGNOSIS — C775 Secondary and unspecified malignant neoplasm of intrapelvic lymph nodes: Secondary | ICD-10-CM | POA: Diagnosis not present

## 2021-09-17 DIAGNOSIS — R3915 Urgency of urination: Secondary | ICD-10-CM | POA: Diagnosis not present

## 2021-09-17 DIAGNOSIS — N403 Nodular prostate with lower urinary tract symptoms: Secondary | ICD-10-CM | POA: Diagnosis not present

## 2021-09-17 DIAGNOSIS — C61 Malignant neoplasm of prostate: Secondary | ICD-10-CM | POA: Diagnosis not present

## 2021-10-06 DIAGNOSIS — C61 Malignant neoplasm of prostate: Secondary | ICD-10-CM | POA: Diagnosis not present

## 2021-10-06 DIAGNOSIS — N3941 Urge incontinence: Secondary | ICD-10-CM | POA: Diagnosis not present

## 2021-10-16 DIAGNOSIS — R339 Retention of urine, unspecified: Secondary | ICD-10-CM | POA: Diagnosis not present

## 2021-12-17 DIAGNOSIS — E6609 Other obesity due to excess calories: Secondary | ICD-10-CM | POA: Diagnosis not present

## 2021-12-17 DIAGNOSIS — I10 Essential (primary) hypertension: Secondary | ICD-10-CM | POA: Diagnosis not present

## 2021-12-17 DIAGNOSIS — Z299 Encounter for prophylactic measures, unspecified: Secondary | ICD-10-CM | POA: Diagnosis not present

## 2021-12-17 DIAGNOSIS — Z1331 Encounter for screening for depression: Secondary | ICD-10-CM | POA: Diagnosis not present

## 2021-12-17 DIAGNOSIS — Z23 Encounter for immunization: Secondary | ICD-10-CM | POA: Diagnosis not present

## 2021-12-17 DIAGNOSIS — Z1339 Encounter for screening examination for other mental health and behavioral disorders: Secondary | ICD-10-CM | POA: Diagnosis not present

## 2021-12-17 DIAGNOSIS — R5383 Other fatigue: Secondary | ICD-10-CM | POA: Diagnosis not present

## 2021-12-17 DIAGNOSIS — E7800 Pure hypercholesterolemia, unspecified: Secondary | ICD-10-CM | POA: Diagnosis not present

## 2021-12-17 DIAGNOSIS — Z7189 Other specified counseling: Secondary | ICD-10-CM | POA: Diagnosis not present

## 2021-12-17 DIAGNOSIS — Z0000 Encounter for general adult medical examination without abnormal findings: Secondary | ICD-10-CM | POA: Diagnosis not present

## 2021-12-17 DIAGNOSIS — Z6830 Body mass index (BMI) 30.0-30.9, adult: Secondary | ICD-10-CM | POA: Diagnosis not present

## 2021-12-17 DIAGNOSIS — Z79899 Other long term (current) drug therapy: Secondary | ICD-10-CM | POA: Diagnosis not present

## 2022-01-07 DIAGNOSIS — R7309 Other abnormal glucose: Secondary | ICD-10-CM | POA: Diagnosis not present

## 2022-01-07 DIAGNOSIS — C61 Malignant neoplasm of prostate: Secondary | ICD-10-CM | POA: Diagnosis not present

## 2022-01-07 DIAGNOSIS — Z299 Encounter for prophylactic measures, unspecified: Secondary | ICD-10-CM | POA: Diagnosis not present

## 2022-01-07 DIAGNOSIS — I10 Essential (primary) hypertension: Secondary | ICD-10-CM | POA: Diagnosis not present

## 2022-01-09 DIAGNOSIS — C61 Malignant neoplasm of prostate: Secondary | ICD-10-CM | POA: Diagnosis not present

## 2022-01-16 DIAGNOSIS — C61 Malignant neoplasm of prostate: Secondary | ICD-10-CM | POA: Diagnosis not present

## 2022-03-03 DIAGNOSIS — I639 Cerebral infarction, unspecified: Secondary | ICD-10-CM | POA: Diagnosis not present

## 2022-03-03 DIAGNOSIS — R413 Other amnesia: Secondary | ICD-10-CM | POA: Diagnosis not present

## 2022-03-03 DIAGNOSIS — I6782 Cerebral ischemia: Secondary | ICD-10-CM | POA: Diagnosis not present

## 2022-03-03 DIAGNOSIS — R42 Dizziness and giddiness: Secondary | ICD-10-CM | POA: Diagnosis not present

## 2022-03-03 DIAGNOSIS — G319 Degenerative disease of nervous system, unspecified: Secondary | ICD-10-CM | POA: Diagnosis not present

## 2022-03-03 DIAGNOSIS — H532 Diplopia: Secondary | ICD-10-CM | POA: Diagnosis not present

## 2022-03-03 DIAGNOSIS — I6381 Other cerebral infarction due to occlusion or stenosis of small artery: Secondary | ICD-10-CM | POA: Diagnosis not present

## 2022-03-09 DIAGNOSIS — G459 Transient cerebral ischemic attack, unspecified: Secondary | ICD-10-CM | POA: Diagnosis not present

## 2022-03-18 DIAGNOSIS — I69393 Ataxia following cerebral infarction: Secondary | ICD-10-CM | POA: Diagnosis not present

## 2022-03-18 DIAGNOSIS — C61 Malignant neoplasm of prostate: Secondary | ICD-10-CM | POA: Diagnosis not present

## 2022-03-18 DIAGNOSIS — E119 Type 2 diabetes mellitus without complications: Secondary | ICD-10-CM | POA: Diagnosis not present

## 2022-03-18 DIAGNOSIS — I69312 Visuospatial deficit and spatial neglect following cerebral infarction: Secondary | ICD-10-CM | POA: Diagnosis not present

## 2022-03-19 DIAGNOSIS — Z8673 Personal history of transient ischemic attack (TIA), and cerebral infarction without residual deficits: Secondary | ICD-10-CM | POA: Diagnosis not present

## 2022-03-19 DIAGNOSIS — I10 Essential (primary) hypertension: Secondary | ICD-10-CM | POA: Diagnosis not present

## 2022-03-19 DIAGNOSIS — Z299 Encounter for prophylactic measures, unspecified: Secondary | ICD-10-CM | POA: Diagnosis not present

## 2022-04-16 DIAGNOSIS — C61 Malignant neoplasm of prostate: Secondary | ICD-10-CM | POA: Diagnosis not present

## 2022-04-17 DIAGNOSIS — R3915 Urgency of urination: Secondary | ICD-10-CM | POA: Diagnosis not present

## 2022-04-17 DIAGNOSIS — C61 Malignant neoplasm of prostate: Secondary | ICD-10-CM | POA: Diagnosis not present

## 2022-04-17 DIAGNOSIS — R351 Nocturia: Secondary | ICD-10-CM | POA: Diagnosis not present

## 2022-04-20 DIAGNOSIS — Z299 Encounter for prophylactic measures, unspecified: Secondary | ICD-10-CM | POA: Diagnosis not present

## 2022-04-20 DIAGNOSIS — C61 Malignant neoplasm of prostate: Secondary | ICD-10-CM | POA: Diagnosis not present

## 2022-04-20 DIAGNOSIS — G319 Degenerative disease of nervous system, unspecified: Secondary | ICD-10-CM | POA: Diagnosis not present

## 2022-04-20 DIAGNOSIS — R42 Dizziness and giddiness: Secondary | ICD-10-CM | POA: Diagnosis not present

## 2022-04-20 DIAGNOSIS — I10 Essential (primary) hypertension: Secondary | ICD-10-CM | POA: Diagnosis not present

## 2022-05-13 DIAGNOSIS — C44320 Squamous cell carcinoma of skin of unspecified parts of face: Secondary | ICD-10-CM | POA: Diagnosis not present

## 2022-05-13 DIAGNOSIS — Z299 Encounter for prophylactic measures, unspecified: Secondary | ICD-10-CM | POA: Diagnosis not present

## 2022-05-13 DIAGNOSIS — I10 Essential (primary) hypertension: Secondary | ICD-10-CM | POA: Diagnosis not present

## 2022-06-09 DIAGNOSIS — H5203 Hypermetropia, bilateral: Secondary | ICD-10-CM | POA: Diagnosis not present

## 2022-06-09 DIAGNOSIS — H04123 Dry eye syndrome of bilateral lacrimal glands: Secondary | ICD-10-CM | POA: Diagnosis not present

## 2022-07-08 DIAGNOSIS — C61 Malignant neoplasm of prostate: Secondary | ICD-10-CM | POA: Diagnosis not present

## 2022-07-15 DIAGNOSIS — R351 Nocturia: Secondary | ICD-10-CM | POA: Diagnosis not present

## 2022-07-15 DIAGNOSIS — N403 Nodular prostate with lower urinary tract symptoms: Secondary | ICD-10-CM | POA: Diagnosis not present

## 2022-07-15 DIAGNOSIS — C775 Secondary and unspecified malignant neoplasm of intrapelvic lymph nodes: Secondary | ICD-10-CM | POA: Diagnosis not present

## 2022-07-15 DIAGNOSIS — C61 Malignant neoplasm of prostate: Secondary | ICD-10-CM | POA: Diagnosis not present

## 2022-07-23 ENCOUNTER — Encounter (HOSPITAL_COMMUNITY): Payer: Self-pay | Admitting: *Deleted

## 2022-07-23 ENCOUNTER — Observation Stay (HOSPITAL_COMMUNITY)
Admission: EM | Admit: 2022-07-23 | Discharge: 2022-07-24 | Disposition: A | Discharge status: Home Health | Payer: Medicare PPO | Source: Home / Self Care | Attending: Student | Admitting: Student

## 2022-07-23 ENCOUNTER — Other Ambulatory Visit: Payer: Self-pay

## 2022-07-23 ENCOUNTER — Emergency Department (HOSPITAL_COMMUNITY): Payer: Medicare PPO

## 2022-07-23 DIAGNOSIS — Z8673 Personal history of transient ischemic attack (TIA), and cerebral infarction without residual deficits: Secondary | ICD-10-CM | POA: Diagnosis not present

## 2022-07-23 DIAGNOSIS — Z8249 Family history of ischemic heart disease and other diseases of the circulatory system: Secondary | ICD-10-CM | POA: Diagnosis not present

## 2022-07-23 DIAGNOSIS — H9190 Unspecified hearing loss, unspecified ear: Secondary | ICD-10-CM | POA: Diagnosis present

## 2022-07-23 DIAGNOSIS — E785 Hyperlipidemia, unspecified: Secondary | ICD-10-CM | POA: Diagnosis present

## 2022-07-23 DIAGNOSIS — Z79899 Other long term (current) drug therapy: Secondary | ICD-10-CM | POA: Insufficient documentation

## 2022-07-23 DIAGNOSIS — R29810 Facial weakness: Secondary | ICD-10-CM | POA: Diagnosis present

## 2022-07-23 DIAGNOSIS — M109 Gout, unspecified: Secondary | ICD-10-CM | POA: Diagnosis present

## 2022-07-23 DIAGNOSIS — F03A11 Unspecified dementia, mild, with agitation: Secondary | ICD-10-CM | POA: Diagnosis present

## 2022-07-23 DIAGNOSIS — Z87442 Personal history of urinary calculi: Secondary | ICD-10-CM | POA: Diagnosis not present

## 2022-07-23 DIAGNOSIS — R5381 Other malaise: Secondary | ICD-10-CM | POA: Diagnosis present

## 2022-07-23 DIAGNOSIS — I6601 Occlusion and stenosis of right middle cerebral artery: Secondary | ICD-10-CM | POA: Diagnosis present

## 2022-07-23 DIAGNOSIS — R42 Dizziness and giddiness: Secondary | ICD-10-CM | POA: Diagnosis not present

## 2022-07-23 DIAGNOSIS — I6330 Cerebral infarction due to thrombosis of unspecified cerebral artery: Secondary | ICD-10-CM | POA: Diagnosis not present

## 2022-07-23 DIAGNOSIS — K219 Gastro-esophageal reflux disease without esophagitis: Secondary | ICD-10-CM | POA: Diagnosis present

## 2022-07-23 DIAGNOSIS — Z7902 Long term (current) use of antithrombotics/antiplatelets: Secondary | ICD-10-CM | POA: Insufficient documentation

## 2022-07-23 DIAGNOSIS — Z515 Encounter for palliative care: Secondary | ICD-10-CM | POA: Diagnosis not present

## 2022-07-23 DIAGNOSIS — R471 Dysarthria and anarthria: Secondary | ICD-10-CM | POA: Diagnosis present

## 2022-07-23 DIAGNOSIS — I639 Cerebral infarction, unspecified: Secondary | ICD-10-CM

## 2022-07-23 DIAGNOSIS — I6389 Other cerebral infarction: Secondary | ICD-10-CM | POA: Diagnosis present

## 2022-07-23 DIAGNOSIS — R4182 Altered mental status, unspecified: Secondary | ICD-10-CM | POA: Diagnosis not present

## 2022-07-23 DIAGNOSIS — R0600 Dyspnea, unspecified: Secondary | ICD-10-CM | POA: Diagnosis not present

## 2022-07-23 DIAGNOSIS — I672 Cerebral atherosclerosis: Secondary | ICD-10-CM | POA: Diagnosis present

## 2022-07-23 DIAGNOSIS — Z66 Do not resuscitate: Secondary | ICD-10-CM | POA: Diagnosis present

## 2022-07-23 DIAGNOSIS — R4781 Slurred speech: Secondary | ICD-10-CM | POA: Diagnosis not present

## 2022-07-23 DIAGNOSIS — R29818 Other symptoms and signs involving the nervous system: Secondary | ICD-10-CM | POA: Diagnosis not present

## 2022-07-23 DIAGNOSIS — R531 Weakness: Secondary | ICD-10-CM | POA: Diagnosis not present

## 2022-07-23 DIAGNOSIS — G8194 Hemiplegia, unspecified affecting left nondominant side: Secondary | ICD-10-CM | POA: Diagnosis present

## 2022-07-23 DIAGNOSIS — R350 Frequency of micturition: Secondary | ICD-10-CM | POA: Diagnosis present

## 2022-07-23 DIAGNOSIS — I959 Hypotension, unspecified: Secondary | ICD-10-CM | POA: Diagnosis not present

## 2022-07-23 DIAGNOSIS — I6523 Occlusion and stenosis of bilateral carotid arteries: Secondary | ICD-10-CM | POA: Diagnosis not present

## 2022-07-23 DIAGNOSIS — I10 Essential (primary) hypertension: Secondary | ICD-10-CM | POA: Diagnosis present

## 2022-07-23 DIAGNOSIS — I700 Atherosclerosis of aorta: Secondary | ICD-10-CM | POA: Diagnosis present

## 2022-07-23 DIAGNOSIS — E119 Type 2 diabetes mellitus without complications: Secondary | ICD-10-CM | POA: Diagnosis present

## 2022-07-23 DIAGNOSIS — Z7982 Long term (current) use of aspirin: Secondary | ICD-10-CM | POA: Diagnosis not present

## 2022-07-23 LAB — URINALYSIS, ROUTINE W REFLEX MICROSCOPIC
Bacteria, UA: NONE SEEN
Bilirubin Urine: NEGATIVE
Glucose, UA: NEGATIVE mg/dL
Hgb urine dipstick: NEGATIVE
Ketones, ur: NEGATIVE mg/dL
Nitrite: NEGATIVE
Protein, ur: NEGATIVE mg/dL
Specific Gravity, Urine: 1.016 (ref 1.005–1.030)
pH: 6 (ref 5.0–8.0)

## 2022-07-23 LAB — CBC WITH DIFFERENTIAL/PLATELET
Abs Immature Granulocytes: 0.01 10*3/uL (ref 0.00–0.07)
Basophils Absolute: 0.1 10*3/uL (ref 0.0–0.1)
Basophils Relative: 1 %
Eosinophils Absolute: 0.2 10*3/uL (ref 0.0–0.5)
Eosinophils Relative: 2 %
HCT: 45 % (ref 39.0–52.0)
Hemoglobin: 15 g/dL (ref 13.0–17.0)
Immature Granulocytes: 0 %
Lymphocytes Relative: 17 %
Lymphs Abs: 1.1 10*3/uL (ref 0.7–4.0)
MCH: 33.6 pg (ref 26.0–34.0)
MCHC: 33.3 g/dL (ref 30.0–36.0)
MCV: 100.7 fL — ABNORMAL HIGH (ref 80.0–100.0)
Monocytes Absolute: 0.6 10*3/uL (ref 0.1–1.0)
Monocytes Relative: 9 %
Neutro Abs: 4.7 10*3/uL (ref 1.7–7.7)
Neutrophils Relative %: 71 %
Platelets: 196 10*3/uL (ref 150–400)
RBC: 4.47 MIL/uL (ref 4.22–5.81)
RDW: 13.2 % (ref 11.5–15.5)
WBC: 6.6 10*3/uL (ref 4.0–10.5)
nRBC: 0 % (ref 0.0–0.2)

## 2022-07-23 LAB — COMPREHENSIVE METABOLIC PANEL
ALT: 14 U/L (ref 0–44)
AST: 19 U/L (ref 15–41)
Albumin: 3.8 g/dL (ref 3.5–5.0)
Alkaline Phosphatase: 68 U/L (ref 38–126)
Anion gap: 9 (ref 5–15)
BUN: 17 mg/dL (ref 8–23)
CO2: 25 mmol/L (ref 22–32)
Calcium: 9.2 mg/dL (ref 8.9–10.3)
Chloride: 104 mmol/L (ref 98–111)
Creatinine, Ser: 0.83 mg/dL (ref 0.61–1.24)
GFR, Estimated: >60 mL/min (ref >60)
Glucose, Bld: 142 mg/dL — ABNORMAL HIGH (ref 70–99)
Potassium: 4.3 mmol/L (ref 3.5–5.1)
Sodium: 138 mmol/L (ref 135–145)
Total Bilirubin: 1 mg/dL (ref 0.3–1.2)
Total Protein: 6.6 g/dL (ref 6.5–8.1)

## 2022-07-23 MED ORDER — CLOPIDOGREL BISULFATE 75 MG PO TABS
75.0000 mg | ORAL_TABLET | Freq: Every evening | ORAL | Status: DC
  Administered 2022-07-23: 75 mg via ORAL
  Filled 2022-07-23: qty 1

## 2022-07-23 MED ORDER — ACETAMINOPHEN 160 MG/5ML PO SOLN: 650.0000 mg | ORAL | Status: DC | PRN

## 2022-07-23 MED ORDER — ATORVASTATIN CALCIUM 40 MG PO TABS
40.0000 mg | ORAL_TABLET | Freq: Every day | ORAL | Status: DC
  Administered 2022-07-23 – 2022-07-24 (×2): 40 mg via ORAL
  Filled 2022-07-23 (×2): qty 1

## 2022-07-23 MED ORDER — ACETAMINOPHEN 325 MG PO TABS: 650.0000 mg | ORAL_TABLET | ORAL | Status: DC | PRN

## 2022-07-23 MED ORDER — SENNOSIDES-DOCUSATE SODIUM 8.6-50 MG PO TABS: 1.0000 | ORAL_TABLET | Freq: Every evening | ORAL | Status: DC | PRN

## 2022-07-23 MED ORDER — LACTATED RINGERS IV BOLUS
1000.0000 mL | Freq: Once | INTRAVENOUS | Status: AC
  Administered 2022-07-23: 1000 mL via INTRAVENOUS

## 2022-07-23 MED ORDER — STROKE: EARLY STAGES OF RECOVERY BOOK: Freq: Once | Status: AC

## 2022-07-23 MED ORDER — ENOXAPARIN SODIUM 40 MG/0.4ML IJ SOSY
40.0000 mg | PREFILLED_SYRINGE | INTRAMUSCULAR | Status: DC
  Administered 2022-07-23: 40 mg via SUBCUTANEOUS
  Filled 2022-07-23: qty 0.4

## 2022-07-23 MED ORDER — ACETAMINOPHEN 650 MG RE SUPP: 650.0000 mg | RECTAL | Status: DC | PRN

## 2022-07-23 MED ORDER — ASPIRIN 81 MG PO TBEC
81.0000 mg | DELAYED_RELEASE_TABLET | Freq: Every day | ORAL | Status: DC
  Administered 2022-07-24: 81 mg via ORAL
  Filled 2022-07-23: qty 1

## 2022-07-23 MED ORDER — PANTOPRAZOLE SODIUM 40 MG PO TBEC
40.0000 mg | DELAYED_RELEASE_TABLET | Freq: Every day | ORAL | Status: DC
  Administered 2022-07-24: 40 mg via ORAL
  Filled 2022-07-23: qty 1

## 2022-07-23 MED ORDER — ASPIRIN 325 MG PO TABS
325.0000 mg | ORAL_TABLET | Freq: Once | ORAL | Status: AC
  Administered 2022-07-23: 325 mg via ORAL
  Filled 2022-07-23: qty 1

## 2022-07-23 NOTE — Assessment & Plan Note (Addendum)
Presenting with dizziness and weakness unable to ambulate.  On exam no focal neurologic deficits, 5 of 5 strength in all extremities.  Reports history of prior stroke 11 years ago- " he was unable to write his name", he has been on Plavix since then and reports compliance.  Mri brain today shows small acute to early subacute infarct right lentiform nucleus, subacute infarct undersurface of left occipital lobe.  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 -Echocardiogram -Carotid Dopplers - 1 LR bolus given, ambulate in a.m. to check for persistent dizziness -UA with trace leukocytes, he denies urinary symptoms.  Hold off on antibiotics

## 2022-07-23 NOTE — ED Notes (Signed)
Pt given a sandwich bag at this time.

## 2022-07-23 NOTE — ED Provider Notes (Signed)
EMERGENCY DEPARTMENT AT [facility] Provider Note  CSN: [id number] Arrival date & time: 07/23/22 1218  Chief Complaint(s) Dizziness  HPI Devon Davis is a 89 y.o. male with PMH CVA, GERD who presents emergency room for evaluation of transient weakness.  Chief complaint states dizziness but patient is denying that he had dizziness today.  He arrives with his caregiver who states that they were going to the post office today and the patient felt like his left leg would not move.  His caretaker witnessed the patient dragging his left leg when attempting to get into the car.  This episode lasted approximately 15 minutes but then resolved.  He states that he spoke with his friend who is a physician in [place] who recommended that he come to the emergency department for further evaluation.  Here in the emergency room, patient is asymptomatic denying any numbness, tingling, weakness, chest pain or shortness of breath or any other systemic symptoms.   Past Medical History Past Medical History:  Diagnosis Date   Arthritis    Bladder stone    GERD (gastroesophageal reflux disease)    History of kidney stones    HOH (hard of hearing)    Stroke [facility]) 2008   Patient Active Problem List   Diagnosis Date Noted   Bladder stones 07/26/2020   Home Medication(s) Prior to Admission medications   Medication Sig Start Date End Date Taking? Authorizing Provider  acetaminophen (TYLENOL) 500 MG tablet Take 1,000 mg by mouth in the morning and at bedtime.    [provider]  allopurinol (ZYLOPRIM) 300 MG tablet Take 300 mg by mouth daily.    [provider]  atorvastatin (LIPITOR) 10 MG tablet Take 10 mg by mouth every evening.    [provider]  clopidogrel (PLAVIX) 75 MG tablet Take 75 mg by mouth every evening.    [provider]  Multiple Vitamins-Minerals (CENTRUM SILVER 50+MEN PO) Take 1 tablet by mouth daily.    [provider]   pantoprazole (PROTONIX) 40 MG tablet Take 40 mg by mouth daily.    [provider]  silodosin (RAPAFLO) 8 MG CAPS capsule Take 8 mg by mouth daily with breakfast.    [provider]                                                                                                                                    Past Surgical History Past Surgical History:  Procedure Laterality Date   BACK SURGERY     Dr Renaye Rakers   CYSTOSCOPY WITH LITHOLAPAXY N/A 07/26/2020   Procedure: CYSTOSCOPY WITH LITHOLAPAXY AND FULGERATION;  Surgeon: Bjorn Pippin, MD;  Location: [facility];  Service: Urology;  Laterality: N/A;   LITHOTRIPSY     PROSTATE BIOPSY N/A 07/26/2020   Procedure: BIOPSY TRANSRECTAL ULTRASONIC PROSTATE (TUBP);  Surgeon: Bjorn Pippin, MD;  Location: [facility];  Service: Urology;  Laterality:  N/A;   Family History History reviewed. No pertinent family history.  Social History Social History   Tobacco Use   Smoking status: Never   Smokeless tobacco: Never  Vaping Use   Vaping Use: Never used  Substance Use Topics   Alcohol use: Yes    Comment: socially   Drug use: No   Allergies Patient has no known allergies.  Review of Systems Review of Systems  Neurological:  Positive for weakness.    Physical Exam Vital Signs  I have reviewed the triage vital signs BP (!) 176/77 (BP Location: Left Arm)   Pulse 75   Temp 98 F (36.7 C) (Oral)   Resp 16   Ht 5\' 10"  (1.778 m)   Wt 89.8 kg   SpO2 98%   BMI 28.41 kg/m   Physical Exam Constitutional:      General: He is not in acute distress.    Appearance: Normal appearance.  HENT:     Head: Normocephalic and atraumatic.     Nose: No congestion or rhinorrhea.  Eyes:     General:        Right eye: No discharge.        Left eye: No discharge.     Extraocular Movements: Extraocular movements intact.     Pupils: Pupils are equal, round, and reactive to light.  Cardiovascular:     Rate and Rhythm: Normal rate and regular  rhythm.     Heart sounds: No murmur heard. Pulmonary:     Effort: No respiratory distress.     Breath sounds: No wheezing or rales.  Abdominal:     General: There is no distension.     Tenderness: There is no abdominal tenderness.  Musculoskeletal:        General: Normal range of motion.     Cervical back: Normal range of motion.  Skin:    General: Skin is warm and dry.  Neurological:     General: No focal deficit present.     Mental Status: He is alert.     Cranial Nerves: No cranial nerve deficit.     Sensory: No sensory deficit.     Motor: No weakness.     ED Results and Treatments Labs (all labs ordered are listed, but only abnormal results are displayed) Labs Reviewed  COMPREHENSIVE METABOLIC PANEL - Abnormal; Notable for the following components:      Result Value   Glucose, Bld 142 (*)    All other components within normal limits  CBC WITH DIFFERENTIAL/PLATELET - Abnormal; Notable for the following components:   MCV 100.7 (*)    All other components within normal limits  URINALYSIS, ROUTINE W REFLEX MICROSCOPIC                                                                                                                          Radiology No results found.  Pertinent labs & imaging results that were available during my care of the  patient were reviewed by me and considered in my medical decision making (see MDM for details).  Medications Ordered in ED Medications  lactated ringers bolus 1,000 mL (1,000 mLs Intravenous Bolus 07/23/22 1252)                                                                                                                                     Procedures Procedures  (including critical care time)  Medical Decision Making / ED Course   This patient presents to the ED for concern of weakness, this involves an extensive number of treatment options, and is a complaint that carries with it a high risk of complications and morbidity.  The  differential diagnosis includes TIA, CVA, Todd's paralysis, seizure, compressive disease in the spine  MDM: Patient seen emergency room for evaluation of transient left leg weakness.  Physical exam here in the emergency department is unremarkable with no focal motor or sensory deficits.  Laboratory evaluation unremarkable.  MRI brain and MR angio brain obtained showing small acute to early subacute infarct in the right lentiform nucleus with an additional subacute cortical infarct in the left occipital lobe.  There is also multivessel high-grade stenosis.  I spoke with Dr. Otelia Limes of neurology who is recommending hospital admission for stroke workup at [facility].  Patient then admitted.   Additional history obtained: -Additional history obtained from friend, caregiver -External records from outside source obtained and reviewed including: Chart review including previous notes, labs, imaging, consultation notes   Lab Tests: -I ordered, reviewed, and interpreted labs.   The pertinent results include:   Labs Reviewed  COMPREHENSIVE METABOLIC PANEL - Abnormal; Notable for the following components:      Result Value   Glucose, Bld 142 (*)    All other components within normal limits  CBC WITH DIFFERENTIAL/PLATELET - Abnormal; Notable for the following components:   MCV 100.7 (*)    All other components within normal limits  URINALYSIS, ROUTINE W REFLEX MICROSCOPIC      EKG   EKG Interpretation  Date/Time:  Thursday Jul 23 2022 12:32:04 EDT Ventricular Rate:  80 PR Interval:  279 QRS Duration: 101 QT Interval:  384 QTC Calculation: 429 R Axis:   19 Text Interpretation: Sinus rhythm Confirmed by Irlene Crudup ([id number]) on 07/26/2022 7:32:31 AM         Imaging Studies ordered: I ordered imaging studies including MRI brain, MR angio brain and neck I independently visualized and interpreted imaging. I agree with the radiologist interpretation   Medicines ordered and prescription  drug management: Meds ordered this encounter  Medications   lactated ringers bolus 1,000 mL    -I have reviewed the patients home medicines and have made adjustments as needed  Critical interventions none  Consultations Obtained: I requested consultation with the neurologist on-call Dr. Otelia Limes,  and discussed lab and imaging findings as well as pertinent plan - they recommend: Medical admission  for stroke workup   Cardiac Monitoring: The patient was maintained on a cardiac monitor.  I personally viewed and interpreted the cardiac monitored which showed an underlying rhythm of: NSR  Social Determinants of Health:  Factors impacting patients care include: none   Reevaluation: After the interventions noted above, I reevaluated the patient and found that they have :stayed the same  Co morbidities that complicate the patient evaluation  Past Medical History:  Diagnosis Date   Arthritis    Bladder stone    GERD (gastroesophageal reflux disease)    History of kidney stones    HOH (hard of hearing)    Stroke [facility]) 2008      Dispostion: I considered admission for this patient, and due to new stroke patient require hospital admission     Final Clinical Impression(s) / ED Diagnoses Final diagnoses:  None     @PCDICTATION @    Glendora Score, MD 08/05/2022 [phone]

## 2022-07-23 NOTE — ED Notes (Signed)
Patient transported to MRI 

## 2022-07-23 NOTE — Assessment & Plan Note (Signed)
-   Resume Protonix ?

## 2022-07-23 NOTE — ED Triage Notes (Signed)
Pt BIB [facility] for c/o feeling dizzy; pt states he has been feeling dizzy for the last couple of days but this am while running errands with his caregiver he became very weak  Pt denies any other sx, only dizzy  BP 176/82 Cbg 193

## 2022-07-23 NOTE — H&P (Signed)
History and Physical    Devon Davis WUJ:[id number] DOB: 06/08/1921 DOA: 07/23/2022  PCP: Ignatius Specking, MD   Patient coming from: Home  I have personally briefly reviewed patient's old medical records in [facility]  Chief Complaint: Devon Davis  HPI: Devon Davis is a 89 y.o. male with medical history significant for Stroke,kidney stone. Patient presented to the ED with complaints of dizziness and weakness.  Patient reports today he went to the post office with his caregiver, when he got back, he had some dizziness so weak he could not stand.  He is unaware of any focal deficits or 1 extremity being more weak than the other, no facial asymmetry was mentioned.  At baseline he ambulates with a walker.  Patient lives alone and has a caregiver that assists him.  He reports chronic speech and memory difficulties that have progressed slowly over time, but he does not remember any acute changes. Reports chronic urinary frequency, unchanged, no dysuria.  No fevers no chills.  No vomiting no loose stools.  ED Course: Blood pressure elevated systolic 175 to 191.  MRI  Brain,MRA head - Small acute to early subacute infarct in the right lentiform nucleus, and additional subacute appearing cortical infarct along the undersurface of the left occipital lobe. 2. Occluded left A1 segment and multifocal high-grade stenosis of the right A2 and A3 segments. 3. Multifocal high-grade stenosis/occlusion of the left P2 segment with no flow related enhancement in the distal PCA branches. 4. Occlusion of the superior right P2/P3 segment. EDP talked to Neurology- ok to admit here for stroke workup.  Review of Systems: As per HPI all other systems reviewed and negative.  Past Medical History:  Diagnosis Date   Arthritis    Bladder stone    GERD (gastroesophageal reflux disease)    History of kidney stones    HOH (hard of hearing)    Stroke [facility]) 2008    Past Surgical History:  Procedure  Laterality Date   BACK SURGERY     Dr Renaye Rakers   CYSTOSCOPY WITH LITHOLAPAXY N/A 07/26/2020   Procedure: CYSTOSCOPY WITH LITHOLAPAXY AND FULGERATION;  Surgeon: Bjorn Pippin, MD;  Location: [facility];  Service: Urology;  Laterality: N/A;   LITHOTRIPSY     PROSTATE BIOPSY N/A 07/26/2020   Procedure: BIOPSY TRANSRECTAL ULTRASONIC PROSTATE (TUBP);  Surgeon: Bjorn Pippin, MD;  Location: [facility];  Service: Urology;  Laterality: N/A;     reports that he has never smoked. He has never used smokeless tobacco. He reports current alcohol use. He reports that he does not use drugs.  No Known Allergies  Family history hypertension  Prior to Admission medications   Medication Sig Start Date End Date Taking? Authorizing Provider  acetaminophen (TYLENOL) 500 MG tablet Take 1,000 mg by mouth in the morning and at bedtime.   Yes [provider]  clopidogrel (PLAVIX) 75 MG tablet Take 75 mg by mouth every evening.   Yes [provider]  Multiple Vitamins-Minerals (CENTRUM SILVER 50+MEN PO) Take 1 tablet by mouth daily.   Yes [provider]  pantoprazole (PROTONIX) 40 MG tablet Take 40 mg by mouth daily.   Yes [provider]    Physical Exam: Vitals:   07/23/22 1530 07/23/22 1535 07/23/22 1545 07/23/22 1600  BP:  (!) 181/79  (!) 184/83  Pulse: 70 74 70 80  Resp: 15 16 20 20   Temp:      TempSrc:      SpO2: 98% 97% 98%  98%  Weight:      Height:        Constitutional:  Hard of hearing, calm, comfortable Vitals:   07/23/22 1530 07/23/22 1535 07/23/22 1545 07/23/22 1600  BP:  (!) 181/79  (!) 184/83  Pulse: 70 74 70 80  Resp: 15 16 20 20   Temp:      TempSrc:      SpO2: 98% 97% 98% 98%  Weight:      Height:       Eyes: PERRL, lids and conjunctivae normal ENMT: Mucous membranes are moist. .  Neck: normal, supple, no masses, no thyromegaly Respiratory: clear to auscultation bilaterally, no wheezing, no crackles. Normal respiratory effort. No accessory muscle use.   Cardiovascular: Regular rate and rhythm, no murmurs / rubs / gallops. No extremity edema.  Abdomen: no tenderness, no masses palpated. No hepatosplenomegaly. Bowel sounds positive.  Musculoskeletal: no clubbing / cyanosis. No joint deformity upper and lower extremities. Skin: no rashes, lesions, ulcers. No induration Neurologic:  Neurological:     Mental Status: She is alert.     GCS: GCS eye subscore is 4. GCS verbal subscore is 5. GCS motor subscore is 6.     Comments: Mental Status: Intact. Alert, oriented, thought content appropriate, able to give a coherent history. Speech fluent without evidence of aphasia. Able to follow 2 step commands without difficulty. Cranial Nerves:  II:  Peripheral visual fields grossly normal, pupils equal, round, reactive to light III,IV, VI: ptosis not present, extra-ocular motions intact bilaterally  V,VII: smile symmetric, eyebrows raise symmetric, facial light touch sensation equal VIII: Hard of hearing Motor:  Normal tone.  5/5 strength of bilateral upper and lower extremity.  Sensory: Sensation intact to light touch in all extremities.  CV: distal pulses palpable throughout   Psychiatric: Normal judgment and insight. Alert and oriented x 3. Normal mood.   Labs on Admission: I have personally reviewed following labs and imaging studies  CBC: Recent Labs  Lab 07/23/22 1200  WBC 6.6  NEUTROABS 4.7  HGB 15.0  HCT 45.0  MCV 100.7*  PLT 196   Basic Metabolic Panel: Recent Labs  Lab 07/23/22 1200  NA 138  K 4.3  CL 104  CO2 25  GLUCOSE 142*  BUN 17  CREATININE 0.83  CALCIUM 9.2   Liver Function Tests: Recent Labs  Lab 07/23/22 1200  AST 19  ALT 14  ALKPHOS 68  BILITOT 1.0  PROT 6.6  ALBUMIN 3.8   Urine analysis:    Component Value Date/Time   COLORURINE YELLOW 07/23/2022 1436   APPEARANCEUR CLEAR 07/23/2022 1436   LABSPEC 1.016 07/23/2022 1436   PHURINE 6.0 07/23/2022 1436   GLUCOSEU NEGATIVE 07/23/2022 1436   HGBUR  NEGATIVE 07/23/2022 1436   BILIRUBINUR NEGATIVE 07/23/2022 1436   KETONESUR NEGATIVE 07/23/2022 1436   PROTEINUR NEGATIVE 07/23/2022 1436   NITRITE NEGATIVE 07/23/2022 1436   LEUKOCYTESUR TRACE (A) 07/23/2022 1436    Radiological Exams on Admission: MR BRAIN WO CONTRAST  Result Date: 07/23/2022 CLINICAL DATA:  TIA EXAM: MRI HEAD WITHOUT CONTRAST MRA HEAD WITHOUT CONTRAST TECHNIQUE: Multiplanar, multi-echo pulse sequences of the brain and surrounding structures were acquired without intravenous contrast. Angiographic images of the Circle of Willis were acquired using MRA technique without intravenous contrast. COMPARISON:  None Available. FINDINGS: MRI HEAD FINDINGS Brain: There is a small focus of diffusion restriction in the right lentiform nucleus with associated FLAIR signal abnormality consistent with acute to early subacute infarct. There is no hemorrhage or mass  effect. There is additional curvilinear diffusion restriction with FLAIR signal abnormality along the undersurface of the left occipital lobe consistent with additional infarct which appears subacute in chronicity. There is no acute intracranial hemorrhage or extra-axial fluid collection. Parenchymal volume is within expected limits for age. The ventricles are normal in size. Patchy FLAIR signal abnormality throughout the remainder of the supratentorial white matter likely reflects sequela of moderate chronic small-vessel ischemic change. The pituitary and suprasellar region are normal. There is no mass lesion. There is no mass effect or midline shift. Vascular: The major flow voids are normal. The vasculature is assessed in full below. Skull and upper cervical spine: Normal marrow signal. Sinuses/Orbits: The paranasal sinuses are clear. Bilateral lens implants are in place. The globes and orbits are otherwise unremarkable. Other: None. MRA HEAD FINDINGS Anterior circulation: The intracranial ICAs are patent with atherosclerotic irregularity  resulting in mild-to-moderate stenosis, left worse than right. The bilateral MCAs are patent, without proximal high-grade stenosis or occlusion. The left A1 segment is occluded. The right A1 segment is normal. The anterior communicating artery is normal. The distal ACA branches appear patent but with multifocal high-grade stenoses of the right A2 and A3 segments. There is no aneurysm or AVM. Posterior circulation: The V4 segments are not included within the field of view. The basilar artery is patent. There is multifocal high-grade stenosis/occlusion of the left P2 segment with no flow related enhancement in the distal PCA branches. The right P1 segment is patent there is occlusion of a superior right P2/P3 segment after a bifurcation. There is no aneurysm or AVM. Anatomic variants: None. IMPRESSION: 1. Small acute to early subacute infarct in the right lentiform nucleus, and additional subacute appearing cortical infarct along the undersurface of the left occipital lobe. 2. Occluded left A1 segment and multifocal high-grade stenosis of the right A2 and A3 segments. 3. Multifocal high-grade stenosis/occlusion of the left P2 segment with no flow related enhancement in the distal PCA branches. 4. Occlusion of the superior right P2/P3 segment. Electronically Signed   By: Lesia Hausen M.D.   On: 07/23/2022 14:31   MR ANGIO HEAD WO CONTRAST  Result Date: 07/23/2022 CLINICAL DATA:  TIA EXAM: MRI HEAD WITHOUT CONTRAST MRA HEAD WITHOUT CONTRAST TECHNIQUE: Multiplanar, multi-echo pulse sequences of the brain and surrounding structures were acquired without intravenous contrast. Angiographic images of the Circle of Willis were acquired using MRA technique without intravenous contrast. COMPARISON:  None Available. FINDINGS: MRI HEAD FINDINGS Brain: There is a small focus of diffusion restriction in the right lentiform nucleus with associated FLAIR signal abnormality consistent with acute to early subacute infarct. There is  no hemorrhage or mass effect. There is additional curvilinear diffusion restriction with FLAIR signal abnormality along the undersurface of the left occipital lobe consistent with additional infarct which appears subacute in chronicity. There is no acute intracranial hemorrhage or extra-axial fluid collection. Parenchymal volume is within expected limits for age. The ventricles are normal in size. Patchy FLAIR signal abnormality throughout the remainder of the supratentorial white matter likely reflects sequela of moderate chronic small-vessel ischemic change. The pituitary and suprasellar region are normal. There is no mass lesion. There is no mass effect or midline shift. Vascular: The major flow voids are normal. The vasculature is assessed in full below. Skull and upper cervical spine: Normal marrow signal. Sinuses/Orbits: The paranasal sinuses are clear. Bilateral lens implants are in place. The globes and orbits are otherwise unremarkable. Other: None. MRA HEAD FINDINGS Anterior circulation: The intracranial ICAs  are patent with atherosclerotic irregularity resulting in mild-to-moderate stenosis, left worse than right. The bilateral MCAs are patent, without proximal high-grade stenosis or occlusion. The left A1 segment is occluded. The right A1 segment is normal. The anterior communicating artery is normal. The distal ACA branches appear patent but with multifocal high-grade stenoses of the right A2 and A3 segments. There is no aneurysm or AVM. Posterior circulation: The V4 segments are not included within the field of view. The basilar artery is patent. There is multifocal high-grade stenosis/occlusion of the left P2 segment with no flow related enhancement in the distal PCA branches. The right P1 segment is patent there is occlusion of a superior right P2/P3 segment after a bifurcation. There is no aneurysm or AVM. Anatomic variants: None. IMPRESSION: 1. Small acute to early subacute infarct in the right  lentiform nucleus, and additional subacute appearing cortical infarct along the undersurface of the left occipital lobe. 2. Occluded left A1 segment and multifocal high-grade stenosis of the right A2 and A3 segments. 3. Multifocal high-grade stenosis/occlusion of the left P2 segment with no flow related enhancement in the distal PCA branches. 4. Occlusion of the superior right P2/P3 segment. Electronically Signed   By: Lesia Hausen M.D.   On: 07/23/2022 14:31    EKG: Independently reviewed.  Sinus rhythm, rate 80, QTc 429.  Old first-degree AV block.  Nonspecific changes to V4 to V6.  Assessment/Plan Principal Problem:   Acute CVA (cerebrovascular accident) (HCC) Active Problems:   GERD (gastroesophageal reflux disease)   Assessment and Plan: * Acute CVA (cerebrovascular accident) (HCC) Presenting with dizziness and weakness unable to ambulate.  On exam no focal neurologic deficits, 5 of 5 strength in all extremities.  Reports history of prior stroke 11 years ago- " he was unable to write his name", he has been on Plavix since then and reports compliance.  Mri brain today shows small acute to early subacute infarct right lentiform nucleus, subacute infarct undersurface of left occipital lobe.  MRA multifocal high-grade stenosis of multiple arteries (See detailed report) - EDP to neurology, okay to admit here at [facility] -Continue Plavix, will start aspirin 325 mg x 1, continue 81 mg daily -Start atorvastatin 40 mg daily -Lipid panel, hemoglobin A1c in a.m. -Echocardiogram -Carotid Dopplers - 1 LR bolus given, ambulate in a.m. to check for persistent dizziness -UA with trace leukocytes, he denies urinary symptoms.  Hold off on antibiotics  GERD (gastroesophageal reflux disease) Resume Protonix.   DVT prophylaxis: Lovenox Code Status: DNR- confirmed with patient at bedside. Family Communication: None at bedside.  Patient's friend, and caregiver were previously at bedside Disposition  Plan: ~ 1 -2 days Consults called: Neurology Admission status: Inpt tele I certify that at the point of admission it is my clinical judgment that the patient will require inpatient hospital care spanning beyond 2 midnights from the point of admission due to high intensity of service, high risk for further deterioration and high frequency of surveillance required.  Author: Onnie Boer, MD 07/23/2022 6:11 PM  For on call review [email or web address].

## 2022-07-24 ENCOUNTER — Inpatient Hospital Stay (HOSPITAL_COMMUNITY): Payer: Medicare PPO

## 2022-07-24 ENCOUNTER — Emergency Department (HOSPITAL_COMMUNITY): Payer: Medicare PPO

## 2022-07-24 ENCOUNTER — Other Ambulatory Visit: Payer: Self-pay

## 2022-07-24 ENCOUNTER — Other Ambulatory Visit (HOSPITAL_COMMUNITY): Payer: Self-pay

## 2022-07-24 ENCOUNTER — Telehealth (HOSPITAL_COMMUNITY): Payer: Self-pay | Admitting: Pharmacy Technician

## 2022-07-24 ENCOUNTER — Inpatient Hospital Stay (HOSPITAL_COMMUNITY)
Admission: EM | Admit: 2022-07-24 | Discharge: 2022-08-22 | DRG: 065 | Disposition: E | Payer: Medicare PPO | Attending: Internal Medicine | Admitting: Internal Medicine

## 2022-07-24 DIAGNOSIS — R4182 Altered mental status, unspecified: Principal | ICD-10-CM

## 2022-07-24 DIAGNOSIS — R5381 Other malaise: Secondary | ICD-10-CM | POA: Diagnosis present

## 2022-07-24 DIAGNOSIS — M109 Gout, unspecified: Secondary | ICD-10-CM | POA: Diagnosis present

## 2022-07-24 DIAGNOSIS — I6389 Other cerebral infarction: Principal | ICD-10-CM | POA: Diagnosis present

## 2022-07-24 DIAGNOSIS — R29710 NIHSS score 10: Secondary | ICD-10-CM | POA: Diagnosis present

## 2022-07-24 DIAGNOSIS — H9190 Unspecified hearing loss, unspecified ear: Secondary | ICD-10-CM | POA: Diagnosis present

## 2022-07-24 DIAGNOSIS — E785 Hyperlipidemia, unspecified: Secondary | ICD-10-CM | POA: Diagnosis present

## 2022-07-24 DIAGNOSIS — G8194 Hemiplegia, unspecified affecting left nondominant side: Secondary | ICD-10-CM | POA: Diagnosis present

## 2022-07-24 DIAGNOSIS — Z87442 Personal history of urinary calculi: Secondary | ICD-10-CM

## 2022-07-24 DIAGNOSIS — F03A11 Unspecified dementia, mild, with agitation: Secondary | ICD-10-CM | POA: Diagnosis present

## 2022-07-24 DIAGNOSIS — Z8673 Personal history of transient ischemic attack (TIA), and cerebral infarction without residual deficits: Secondary | ICD-10-CM

## 2022-07-24 DIAGNOSIS — Z7982 Long term (current) use of aspirin: Secondary | ICD-10-CM

## 2022-07-24 DIAGNOSIS — Z515 Encounter for palliative care: Secondary | ICD-10-CM

## 2022-07-24 DIAGNOSIS — I672 Cerebral atherosclerosis: Secondary | ICD-10-CM | POA: Diagnosis present

## 2022-07-24 DIAGNOSIS — I6601 Occlusion and stenosis of right middle cerebral artery: Secondary | ICD-10-CM | POA: Diagnosis present

## 2022-07-24 DIAGNOSIS — Z7902 Long term (current) use of antithrombotics/antiplatelets: Secondary | ICD-10-CM

## 2022-07-24 DIAGNOSIS — I639 Cerebral infarction, unspecified: Secondary | ICD-10-CM | POA: Diagnosis present

## 2022-07-24 DIAGNOSIS — K219 Gastro-esophageal reflux disease without esophagitis: Secondary | ICD-10-CM | POA: Diagnosis present

## 2022-07-24 DIAGNOSIS — I10 Essential (primary) hypertension: Secondary | ICD-10-CM | POA: Diagnosis present

## 2022-07-24 DIAGNOSIS — R29810 Facial weakness: Secondary | ICD-10-CM | POA: Diagnosis present

## 2022-07-24 DIAGNOSIS — M1990 Unspecified osteoarthritis, unspecified site: Secondary | ICD-10-CM | POA: Diagnosis present

## 2022-07-24 DIAGNOSIS — I700 Atherosclerosis of aorta: Secondary | ICD-10-CM | POA: Diagnosis present

## 2022-07-24 DIAGNOSIS — Z8546 Personal history of malignant neoplasm of prostate: Secondary | ICD-10-CM

## 2022-07-24 DIAGNOSIS — E119 Type 2 diabetes mellitus without complications: Secondary | ICD-10-CM | POA: Diagnosis present

## 2022-07-24 DIAGNOSIS — R471 Dysarthria and anarthria: Secondary | ICD-10-CM | POA: Diagnosis present

## 2022-07-24 DIAGNOSIS — Z79899 Other long term (current) drug therapy: Secondary | ICD-10-CM

## 2022-07-24 DIAGNOSIS — Z7409 Other reduced mobility: Secondary | ICD-10-CM | POA: Diagnosis present

## 2022-07-24 DIAGNOSIS — R350 Frequency of micturition: Secondary | ICD-10-CM | POA: Diagnosis present

## 2022-07-24 DIAGNOSIS — Z66 Do not resuscitate: Secondary | ICD-10-CM | POA: Diagnosis present

## 2022-07-24 DIAGNOSIS — Z8249 Family history of ischemic heart disease and other diseases of the circulatory system: Secondary | ICD-10-CM

## 2022-07-24 LAB — ECHOCARDIOGRAM COMPLETE
AR max vel: 1.25 cm2
AV Area VTI: 1.27 cm2
AV Area mean vel: 1.19 cm2
AV Mean grad: 12 mmHg
AV Peak grad: 20.1 mmHg
Ao pk vel: 2.24 m/s
Area-P 1/2: 3.13 cm2
Height: 69 in
S' Lateral: 3.8 cm
Weight: 3100.55 oz

## 2022-07-24 LAB — LIPID PANEL
Cholesterol: 170 mg/dL (ref 0–200)
HDL: 38 mg/dL — ABNORMAL LOW (ref >40)
LDL Cholesterol: 101 mg/dL — ABNORMAL HIGH (ref 0–99)
Total CHOL/HDL Ratio: 4.5 RATIO
Triglycerides: 157 mg/dL — ABNORMAL HIGH (ref <150)
VLDL: 31 mg/dL (ref 0–40)

## 2022-07-24 LAB — HEMOGLOBIN A1C
Hgb A1c MFr Bld: 6.6 % — ABNORMAL HIGH (ref 4.8–5.6)
Mean Plasma Glucose: 142.72 mg/dL

## 2022-07-24 LAB — CBG MONITORING, ED: Glucose-Capillary: 187 mg/dL — ABNORMAL HIGH (ref 70–99)

## 2022-07-24 MED ORDER — PERFLUTREN LIPID MICROSPHERE
1.0000 mL | INTRAVENOUS | Status: AC | PRN
  Administered 2022-07-24: 2 mL via INTRAVENOUS

## 2022-07-24 MED ORDER — ATORVASTATIN CALCIUM 40 MG PO TABS: 40.0000 mg | ORAL_TABLET | Freq: Every day | ORAL | 3 refills | Status: DC

## 2022-07-24 MED ORDER — ASPIRIN 81 MG PO TBEC: 81.0000 mg | DELAYED_RELEASE_TABLET | Freq: Every day | ORAL | 3 refills | Status: DC

## 2022-07-24 MED ORDER — ACETAMINOPHEN 325 MG PO TABS: 650.0000 mg | ORAL_TABLET | ORAL | 2 refills | Status: DC | PRN

## 2022-07-24 MED ORDER — CLOPIDOGREL BISULFATE 75 MG PO TABS: 75.0000 mg | ORAL_TABLET | Freq: Every day | ORAL | 3 refills | Status: DC

## 2022-07-24 MED ORDER — PANTOPRAZOLE SODIUM 40 MG PO TBEC: 40.0000 mg | DELAYED_RELEASE_TABLET | Freq: Every day | ORAL | 3 refills | Status: DC

## 2022-07-24 MED ORDER — TICAGRELOR 90 MG PO TABS: 90.0000 mg | ORAL_TABLET | Freq: Two times a day (BID) | ORAL | 0 refills | Status: DC

## 2022-07-24 NOTE — Progress Notes (Signed)
   07/23/22 2214  Provider Notification  Provider Name/Title Dr. Lenoria Farrier  Date Provider Notified 07/23/22  Time Provider Notified 2214  Method of Notification Page (secure chat)  Notification Reason Other (Comment) (elvated BP 183/75)  Provider response No new orders  Date of Provider Response 07/23/22  Time of Provider Response 2220

## 2022-07-24 NOTE — Discharge Instructions (Signed)
1)The Cardiology office will arrange for 30 day Heart monitor --- you will get this in the mail-- -it will help [place] look for irregular heartbeat that may increase your risk for stroke 2)Stop Plavix/Clopidogrel for the next 30 days while you are taking Brilinta/Ticagrelor 3)Take Aspirin 81 mg once daily and Brilinta/Ticagrelor 90 mg twice daily for 1 month 4)After 1 month... Stop the Brilinta/ticagrelor and take a combination of aspirin 81 and Plavix 75 mg for 60 days And then Stop the Aspirin 90 days from now and take just Plavix/Clopidogrel 75 mg daily indefinitely 5) follow-up with neurologist Dr. Pearlean Brownie in 6 to 8 weeks for recheck 6) please note that there are several changes to your medications--- please pay attention

## 2022-07-24 NOTE — Care Management Obs Status (Signed)
MEDICARE OBSERVATION STATUS NOTIFICATION   Patient Details  Name: Devon Davis MRN: [id number] Date of Birth: 26-Jun-1921   Medicare Observation Status Notification Given:  Yes    Elliot Gault, LCSW 08/04/2022, 3:49 PM

## 2022-07-24 NOTE — Plan of Care (Signed)
  Problem: Acute Rehab PT Goals(only PT should resolve) Goal: Pt Will Go Supine/Side To Sit Outcome: Progressing Flowsheets (Taken 08/16/2022 1212) Pt will go Supine/Side to Sit:  with supervision  with modified independence Goal: Patient Will Transfer Sit To/From Stand Outcome: Progressing Flowsheets (Taken 07/22/2022 1212) Patient will transfer sit to/from stand: with supervision Goal: Pt Will Transfer Bed To Chair/Chair To Bed Outcome: Progressing Flowsheets (Taken 08/07/2022 1212) Pt will Transfer Bed to Chair/Chair to Bed: with supervision Goal: Pt Will Ambulate Outcome: Progressing Flowsheets (Taken 08/07/2022 1212) Pt will Ambulate:  100 feet  with supervision  with rolling walker   12:13 PM, 07/26/2022 Ocie Bob, MPT Physical Therapist with [facility] [phone] office [phone] mobile phone

## 2022-07-24 NOTE — TOC Initial Note (Signed)
Transition of Care [facility]) - Initial/Assessment Note    Patient Details  Name: Devon Davis MRN: [id number] Date of Birth: 1921/07/01  Transition of Care [facility]) CM/SW Contact:    Elliot Gault, LCSW Phone Number: 08-16-2022, 11:38 AM  Clinical Narrative:                  Pt admitted from home. PT/OT recommending HH at dc. Pt has a caregiver except for overnight hours.  Pt extremely HOH and does not have working hearing aids here at the moment. Spoke with pt's caregiver, [name], to assess. Updated [name] on PT recommendation for 24/7 care for a couple days and he states that he can arrange for that. Pt has a RW at home for ambulation.  Pt/caregiver in agreement with [facility] referral. CMS provider options reviewed and referred to [facility] as requested.  Anticipating dc later today per MD.  Expected Discharge Plan: Home w Home Health Services Barriers to Discharge: Continued Medical Work up   Patient Goals and CMS Choice Patient states their goals for this hospitalization and ongoing recovery are:: go home CMS Medicare.gov Compare Post Acute Care list provided to:: Patient Represenative (must comment) Choice offered to / list presented to :  (caregiver)      Expected Discharge Plan and Services In-house Referral: Clinical Social Work   Post Acute Care Choice: Home Health Living arrangements for the past 2 months: Single Family Home                           HH Arranged: PT, OT HH Agency: [facility] Date [facility] Agency Contacted: Aug 16, 2022   Representative spoke with at [facility] Agency: Kandee Keen  Prior Living Arrangements/Services Living arrangements for the past 2 months: Single Family Home Lives with:: Self Patient language and need for interpreter reviewed:: Yes Do you feel safe going back to the place where you live?: Yes      Need for Family Participation in Patient Care: Yes (Comment) Care giver support system in place?: Yes (comment) Current home services:  DME Criminal Activity/Legal Involvement Pertinent to Current Situation/Hospitalization: No - Comment as needed  Activities of Daily Living Home Assistive Devices/Equipment: Dan Humphreys (specify type) ADL Screening (condition at time of admission) Patient's cognitive ability adequate to safely complete daily activities?: Yes Is the patient deaf or have difficulty hearing?: Yes Does the patient have difficulty seeing, even when wearing glasses/contacts?: Yes Does the patient have difficulty concentrating, remembering, or making decisions?: Yes Patient able to express need for assistance with ADLs?: Yes Does the patient have difficulty dressing or bathing?: Yes Independently performs ADLs?: Yes (appropriate for developmental age) Does the patient have difficulty walking or climbing stairs?: Yes Weakness of Legs: None Weakness of Arms/Hands: None  Permission Sought/Granted Permission sought to share information with : Facility Industrial/product designer granted to share information with : Yes, Verbal Permission Granted     Permission granted to share info w AGENCY: [facility]        Emotional Assessment       Orientation: : Oriented to Self, Oriented to Place, Oriented to  Time, Oriented to Situation Alcohol / Substance Use: Not Applicable Psych Involvement: No (comment)  Admission diagnosis:  Acute CVA (cerebrovascular accident) [facility]) [I63.9] Cerebrovascular accident (CVA), unspecified mechanism (HCC) [I63.9] Patient Active Problem List   Diagnosis Date Noted   Acute CVA (cerebrovascular accident) (HCC) 07/23/2022   GERD (gastroesophageal reflux disease) 07/23/2022   Bladder stones 07/26/2020  PCP:  Ignatius Specking, MD [facility]   [facility] [place] [place] - [address] [place] [address] Phone: [phone] Fax: [phone]     Social Determinants of Health (SDOH) Social History: SDOH Screenings   Food Insecurity: No Food Insecurity  (07/23/2022)  Housing: Low Risk  (07/23/2022)  Transportation Needs: No Transportation Needs (07/23/2022)  Utilities: Not At Risk (07/23/2022)  Tobacco Use: Low Risk  (07/23/2022)   SDOH Interventions:     Readmission Risk Interventions    07/31/2022   11:37 AM  Readmission Risk Prevention Plan  Medication Screening Complete  Transportation Screening Complete

## 2022-07-24 NOTE — Progress Notes (Signed)
  Echocardiogram 2D Echocardiogram has been performed.  Devon Davis 08-02-2022, 2:13 PM

## 2022-07-24 NOTE — Plan of Care (Signed)
  Problem: Acute Rehab OT Goals (only OT should resolve) Goal: Pt. Will Perform Grooming Flowsheets (Taken Jul 28, 2022 0922) Pt Will Perform Grooming:  with modified independence  standing Goal: Pt. Will Perform Upper Body Bathing Flowsheets (Taken 08/08/2022 0922) Pt Will Perform Upper Body Bathing:  with modified independence  sitting Goal: Pt. Will Perform Upper Body Dressing Flowsheets (Taken 08/21/2022 0922) Pt Will Perform Upper Body Dressing:  with modified independence  sitting Goal: Pt. Will Perform Lower Body Dressing Flowsheets (Taken 07/31/2022 0922) Pt Will Perform Lower Body Dressing:  with modified independence  sitting/lateral leans Goal: Pt. Will Transfer To Toilet Flowsheets (Taken 08/07/2022 [phone]) Pt Will Transfer to Toilet:  with modified independence  ambulating Goal: Pt. Will Perform Toileting-Clothing Manipulation Flowsheets (Taken Jul 28, 2022 0922) Pt Will Perform Toileting - Clothing Manipulation and hygiene:  with modified independence  sitting/lateral leans Goal: Pt/Caregiver Will Perform Home Exercise Program Flowsheets (Taken 08/03/2022 [phone]) Pt/caregiver will Perform Home Exercise Program:  Increased strength  Both right and left upper extremity  Independently  Tamico Mundo OT, MOT

## 2022-07-24 NOTE — Evaluation (Signed)
Occupational Therapy Evaluation Patient Details Name: Devon Davis MRN: [id number] DOB: [birth year] Today's Date: 08/01/2022   History of Present Illness Devon Davis is a 89 y.o. male with medical history significant for Stroke,kidney stone. Patient presented to the ED with complaints of dizziness and weakness.  Patient reports today he went to the post office with his caregiver, when he got back, he had some dizziness so weak he could not stand.  He is unaware of any focal deficits or 1 extremity being more weak than the other, no facial asymmetry was mentioned.  At baseline he ambulates with a walker.  Patient lives alone and has a caregiver that assists him.  He reports chronic speech and memory difficulties that have progressed slowly over time, but he does not remember any acute changes.  Reports chronic urinary frequency, unchanged, no dysuria.  No fevers no chills.  No vomiting no loose stools. (per MD)   Clinical Impression   Pt agreeable to OT and PT co-evaluation. Pt assisted by care attendants mostly for IADL's at baseline. Pt reports independent ADL's and ambulation with rollator. Pt required min A for boosting from EOB today and more min G assist for ambulation. Poor to fair seated balance due to posterior lean, but able to complete seated lower body dressing with min G assist. Pt reports ability to have 24/7 care arranged. This is recommended due to fall risk at this time. Pt will benefit from continued OT in the hospital and recommended venue below to increase strength, balance, and endurance for safe ADL's.        Recommendations for follow up therapy are one component of a multi-disciplinary discharge planning process, led by the attending physician.  Recommendations may be updated based on patient status, additional functional criteria and insurance authorization.   Assistance Recommended at Discharge Frequent or constant Supervision/Assistance  Patient can return home  with the following A little help with walking and/or transfers;A little help with bathing/dressing/bathroom;Assistance with cooking/housework;Assist for transportation;Help with stairs or ramp for entrance    Functional Status Assessment  Patient has had a recent decline in their functional status and demonstrates the ability to make significant improvements in function in a reasonable and predictable amount of time.  Equipment Recommendations  None recommended by OT           Precautions / Restrictions Precautions Precautions: Fall Restrictions Weight Bearing Restrictions: No      Mobility Bed Mobility Overal bed mobility: Needs Assistance Bed Mobility: Supine to Sit, Sit to Supine     Supine to sit: Min guard Sit to supine: Min guard   General bed mobility comments: MIld labored movement.    Transfers Overall transfer level: Needs assistance Equipment used: Rolling walker (2 wheels) Transfers: Sit to/from Stand, Bed to chair/wheelchair/BSC Sit to Stand: Min assist     Step pivot transfers: Min guard, Min assist     General transfer comment: Min A to boost from EOB and chair. More min G once pivoting or ambulating with RW.      Balance Overall balance assessment: Needs assistance Sitting-balance support: No upper extremity supported, Feet supported Sitting balance-Leahy Scale: Poor Sitting balance - Comments: poor to fair Postural control: Posterior lean Standing balance support: Bilateral upper extremity supported, During functional activity, Reliant on assistive device for balance Standing balance-Leahy Scale: Fair Standing balance comment: using RW  ADL either performed or assessed with clinical judgement   ADL Overall ADL's : Needs assistance/impaired     Grooming: Min guard;Minimal assistance;Sitting   Upper Body Bathing: Min guard;Sitting   Lower Body Bathing: Min guard;Minimal assistance;Sitting/lateral leans    Upper Body Dressing : Min guard;Sitting   Lower Body Dressing: Min guard;Sitting/lateral leans Lower Body Dressing Details (indicate cue type and reason): Pt able to doff socks and don shoes at EOB. Toilet Transfer: Minimal assistance;Stand-pivot;Rolling walker (2 wheels) Toilet Transfer Details (indicate cue type and reason): Simulated via EOB to chair transfer with RW. Toileting- Clothing Manipulation and Hygiene: Min guard;Sitting/lateral lean;Minimal assistance       Functional mobility during ADLs: Min guard;Minimal assistance;Rolling walker (2 wheels) General ADL Comments: Pt able to ambulate over 50 feet in hall and room with RW.     Vision Baseline Vision/History: 1 Wears glasses Ability to See in Adequate Light: 1 Impaired Patient Visual Report: Other (comment) (Pt reports he just got his new glasses and has been adjusting to them.) Vision Assessment?: Yes Tracking/Visual Pursuits: Able to track stimulus in all quads without difficulty (Cuing to continue following pen at times.) Visual Fields:  (Mild superior visual field deficits bilaterally.)                Pertinent Vitals/Pain Pain Assessment Pain Assessment: Faces Faces Pain Scale: No hurt     Hand Dominance Right   Extremity/Trunk Assessment Upper Extremity Assessment Upper Extremity Assessment: Generalized weakness;RUE deficits/detail;LUE deficits/detail RUE Deficits / Details: Pt reports decreased dexterity in ulnar side of hand in 3rd through 5th digits. RUE Coordination: decreased fine motor (D) LUE Coordination: decreased fine motor   Lower Extremity Assessment Lower Extremity Assessment: Defer to PT evaluation   Cervical / Trunk Assessment Cervical / Trunk Assessment: Kyphotic   Communication Communication Communication: HOH   Cognition Arousal/Alertness: Awake/alert Behavior During Therapy: WFL for tasks assessed/performed Overall Cognitive Status: Within Functional Limits for tasks  assessed                                                        Home Living Family/patient expects to be discharged to:: Private residence Living Arrangements: Alone Available Help at Discharge: Personal care attendant;Available PRN/intermittently (Pt reports he can get 24/7.) Type of Home: House Home Access: Other (comment) (Unsure. Pt very hard of hearing and did not clearly answer.)     Home Layout: Laundry or work area in basement;Able to live on main level with bedroom/bathroom     Bathroom Shower/Tub: Producer, television/film/video: Standard     Home Equipment: Rollator (4 wheels);BSC/3in1;Shower seat   Additional Comments: Pt reports that his home is handicapped. Has care givers prsent every day for a fluctuating amount of time.      Prior Functioning/Environment Prior Level of Function : Needs assist       Physical Assist : ADLs (physical)   ADLs (physical): IADLs Mobility Comments: [place] ambulator with rollator. ADLs Comments: Independent with ADL's. Assisted by care attendants for IADL's.        OT Problem List: Decreased strength;Decreased activity tolerance;Impaired balance (sitting and/or standing);Decreased safety awareness;Decreased coordination      OT Treatment/Interventions: Self-care/ADL training;Therapeutic exercise;Therapeutic activities;Patient/family education;Balance training;Neuromuscular education;DME and/or AE instruction    OT Goals(Current goals can be found in the care plan section)  Acute Rehab OT Goals Patient Stated Goal: return home OT Goal Formulation: With patient Time For Goal Achievement: 08/07/22 Potential to Achieve Goals: Good  OT Frequency: Min 2X/week    Co-evaluation PT/OT/SLP Co-Evaluation/Treatment: Yes Reason for Co-Treatment: To address functional/ADL transfers   OT goals addressed during session: ADL's and self-care                       End of Session Equipment Utilized  During Treatment: Rolling walker (2 wheels);Gait belt  Activity Tolerance: Patient tolerated treatment well Patient left: in chair;with call bell/phone within reach  OT Visit Diagnosis: Unsteadiness on feet (R26.81);Other abnormalities of gait and mobility (R26.89);Muscle weakness (generalized) (M62.81);Other symptoms and signs involving the nervous system (Z61.096)                Time: 0454-0981 OT Time Calculation (min): 22 min Charges:  OT General Charges $OT Visit: 1 Visit OT Evaluation $OT Eval Low Complexity: 1 Low  Bailie Christenbury OT, MOT  Danie Chandler 07/22/2022, 9:19 AM

## 2022-07-24 NOTE — Consult Note (Addendum)
I connected with  [name] on Aug 23, 2022 by a video enabled telemedicine application and verified that I am speaking with the correct person using two identifiers.   I discussed the limitations of evaluation and management by telemedicine. The patient expressed understanding and agreed to proceed.  Location of patient: [facility] Location of physician: [facility]  Neurology Consultation Reason for Consult: stroke Referring Physician: Dr Kennis Carina  CC: stroke  History is obtained from: Patient's caregiver at bedside, patient, chart review  HPI: Devon Davis is a 89 y.o. male with no significant past medical history who presented with sudden onset left leg weakness with started on 07/23/2022.  Per caregiver, he sat in the car at around 11 AM yesterday and was at his baseline.  At around 1150 when he was trying to get out of currently had left leg weakness.  Therefore he was brought into the emergency room.  On arrival blood pressure was 176/77.  Denies any other focal weakness, blurred vision, paresthesias, speech disturbance.  Denies any similar symptoms in the past.  Takes Plavix 75 mg daily.  LKN: 11 am 07/23/2022 No tpa as outside window No thrombectomy as no lvo Event happened at home mRS 3  ROS: All other systems reviewed and negative except as noted in the HPI.   Past Medical History:  Diagnosis Date   Arthritis    Bladder stone    GERD (gastroesophageal reflux disease)    History of kidney stones    HOH (hard of hearing)    Stroke (HCC) 2008    History reviewed. No pertinent family history.  Social History:  reports that he has never smoked. He has never used smokeless tobacco. He reports current alcohol use. He reports that he does not use drugs.   Medications Prior to Admission  Medication Sig Dispense Refill Last Dose   acetaminophen (TYLENOL) 500 MG tablet Take 1,000 mg by mouth in the morning and at bedtime.   07/23/2022   clopidogrel (PLAVIX)  75 MG tablet Take 75 mg by mouth every evening.   07/22/2022 at 2200   Multiple Vitamins-Minerals (CENTRUM SILVER 50+MEN PO) Take 1 tablet by mouth daily.   07/23/2022   pantoprazole (PROTONIX) 40 MG tablet Take 40 mg by mouth daily.   07/23/2022      Exam: Current vital signs: BP (!) 169/75 (BP Location: Right Arm)   Pulse 74   Temp 97.9 F (36.6 C) (Oral)   Resp 18   Ht 5\' 9"  (1.753 m)   Wt 87.9 kg   SpO2 95%   BMI 28.62 kg/m  Vital signs in last 24 hours: Temp:  [97.9 F (36.6 C)-98.5 F (36.9 C)] 97.9 F (36.6 C) (05/03 0616) Pulse Rate:  [70-86] 74 (05/03 0616) Resp:  [14-20] 18 (05/03 0616) BP: (152-192)/(71-120) 169/75 (05/03 0616) SpO2:  [95 %-100 %] 95 % (05/03 0616) Weight:  [87.9 kg-89.8 kg] 87.9 kg (05/02 1717)   Physical Exam  Constitutional: Appears well-developed and well-nourished.  Neuro: AOx3, cranial nerves II to XII appear grossly intact, no aphasia, antigravity strength in upper extremities without drift, FTN intact bilaterally, sensation intact to light touch  NIHSS 0  I have reviewed labs in epic and the results pertinent to this consultation are: CBC:  Recent Labs  Lab 07/23/22 1200  WBC 6.6  NEUTROABS 4.7  HGB 15.0  HCT 45.0  MCV 100.7*  PLT 196    Basic Metabolic Panel:  Lab Results  Component Value Date  NA 138 07/23/2022   K 4.3 07/23/2022   CO2 25 07/23/2022   GLUCOSE 142 (H) 07/23/2022   BUN 17 07/23/2022   CREATININE 0.83 07/23/2022   CALCIUM 9.2 07/23/2022   GFRNONAA >60 07/23/2022   Lipid Panel:  Lab Results  Component Value Date   LDLCALC 101 (H) 08/09/22   HgbA1c:  Lab Results  Component Value Date   HGBA1C 6.6 (H) 2022/08/09   Urine Drug Screen: No results found for: "LABOPIA", "COCAINSCRNUR", "LABBENZ", "AMPHETMU", "THCU", "LABBARB"  Alcohol Level No results found for: "ETH"   I have reviewed the images obtained:  MRI Brain wo contrast and MRA head wo contrast 07/23/2022:   1. Small acute to early subacute  infarct in the right lentiform nucleus, and additional subacute appearing cortical infarct along the undersurface of the left occipital lobe. 2. Occluded left A1 segment and multifocal high-grade stenosis of the right A2 and A3 segments. 3. Multifocal high-grade stenosis/occlusion of the left P2 segment with no flow related enhancement in the distal PCA branches. 4. Occlusion of the superior right P2/P3 segment.     ASSESSMENT/PLAN: 89 year old male with acute ischemic stroke as noted above  Acute ischemic stroke -Likely embolic versus small vessel disease  Recommendations: -Recommend aspirin 81 mg daily and Brilinta 90 mg twice daily for 1 month  followed by aspirin 81 mg daily and Plavix 75 mg daily for 2 months.  If Marden Noble is not covered by insurance, can do aspirin 81 mg daily and Plavix and 5 mg daily for 3 months  -Recommend atorvastatin 40 mg daily -Management of comorbidities (likely has undiagnosed hypertension and diabetes) -If TTE and carotid ultrasound are within normal limits, recommend 30-day event monitor -Goal blood pressure: Normotension -Follow-up with DR Pearlean Brownie at [facility] in 4 to 6 weeks -Plan discussed with Dr. Mariea Clonts via secure chat   Thank you for allowing [place] to participate in the care of this patient. If you have any further questions, please contact  me or neurohospitalist.   Lindie Spruce Epilepsy [facility] neurohospitalist

## 2022-07-24 NOTE — Telephone Encounter (Signed)
Pharmacy Patient Advocate Encounter  Insurance verification completed.    The patient is insured through [employer] Medicare Part D   The patient is currently admitted and ran test claims for the following: Brilinta .  Copays and coinsurance results were relayed to Inpatient clinical team.  

## 2022-07-24 NOTE — ED Provider Notes (Signed)
[place] EMERGENCY DEPARTMENT AT [facility] Provider Note  CSN: [id number] Arrival date & time: 08/15/2022 2352  Chief Complaint(s) No chief complaint on file.  HPI Devon Davis is a 89 y.o. male with past medical history as below, significant for GERD, hard of hearing, stroke who presents to the ED with complaint of strokelike symptoms.  Patient was admitted at [facility] yesterday secondary to acute stroke.  MRI found to have small acute to early subacute infarct of right lentiform nucleus and subacute cortical infarct in left occipital lobe, he was admitted and subsequently discharged around 4:00 today.  He is on Brilinta.  Around 6 PM began having difficulty speaking, left-sided weakness, EMS was called and patient was compelled to come to the hospital by his family.  Patient is DNR/DNI.    Past Medical History Past Medical History:  Diagnosis Date   Arthritis    Bladder stone    GERD (gastroesophageal reflux disease)    History of kidney stones    HOH (hard of hearing)    Stroke [facility]) 2008   Patient Active Problem List   Diagnosis Date Noted   Acute CVA (cerebrovascular accident) (HCC) 07/23/2022   GERD (gastroesophageal reflux disease) 07/23/2022   Bladder stones 07/26/2020   Home Medication(s) Prior to Admission medications   Medication Sig Start Date End Date Taking? Authorizing Provider  acetaminophen (TYLENOL) 325 MG tablet Take 2 tablets (650 mg total) by mouth every 4 (four) hours as needed for mild pain, headache or fever (or temp > 37.5 C (99.5 F)). 08/07/2022   Shon Hale, MD  aspirin EC 81 MG tablet Take 1 tablet (81 mg total) by mouth daily with breakfast. 08/09/2022   Mariea Clonts, Courage, MD  atorvastatin (LIPITOR) 40 MG tablet Take 1 tablet (40 mg total) by mouth daily. 07/25/22   Shon Hale, MD  clopidogrel (PLAVIX) 75 MG tablet Take 1 tablet (75 mg total) by mouth daily. 08/24/22   Shon Hale, MD  Multiple Vitamins-Minerals (CENTRUM  SILVER 50+MEN PO) Take 1 tablet by mouth daily.    [provider]  pantoprazole (PROTONIX) 40 MG tablet Take 1 tablet (40 mg total) by mouth daily. 08/12/2022   Shon Hale, MD  ticagrelor (BRILINTA) 90 MG TABS tablet Take 1 tablet (90 mg total) by mouth 2 (two) times daily. For 1 month only 08/13/2022 08/23/22  Shon Hale, MD                                                                                                                                    Past Surgical History Past Surgical History:  Procedure Laterality Date   BACK SURGERY     Dr Renaye Rakers   CYSTOSCOPY WITH LITHOLAPAXY N/A 07/26/2020   Procedure: CYSTOSCOPY WITH LITHOLAPAXY AND FULGERATION;  Surgeon: Bjorn Pippin, MD;  Location: [facility] ORS;  Service: Urology;  Laterality: N/A;   LITHOTRIPSY     PROSTATE BIOPSY  N/A 07/26/2020   Procedure: BIOPSY TRANSRECTAL ULTRASONIC PROSTATE (TUBP);  Surgeon: Bjorn Pippin, MD;  Location: [facility] ORS;  Service: Urology;  Laterality: N/A;   Family History No family history on file.  Social History Social History   Tobacco Use   Smoking status: Never   Smokeless tobacco: Never  Vaping Use   Vaping Use: Never used  Substance Use Topics   Alcohol use: Yes    Comment: socially   Drug use: No   Allergies Patient has no known allergies.  Review of Systems Review of Systems  Physical Exam Vital Signs  I have reviewed the triage vital signs There were no vitals taken for this visit. Physical Exam  ED Results and Treatments Labs (all labs ordered are listed, but only abnormal results are displayed) Labs Reviewed  CBG MONITORING, ED - Abnormal; Notable for the following components:      Result Value   Glucose-Capillary 187 (*)    All other components within normal limits  ETHANOL  PROTIME-INR  APTT  CBC  DIFFERENTIAL  COMPREHENSIVE METABOLIC PANEL  RAPID URINE DRUG SCREEN, HOSP PERFORMED  URINALYSIS, ROUTINE W REFLEX MICROSCOPIC  I-STAT CHEM 8, ED                                                                                                                           Radiology ECHOCARDIOGRAM COMPLETE  Result Date: 08/08/22    ECHOCARDIOGRAM REPORT   Patient Name:   [name] Date of Exam: 08-08-2022 Medical Rec #:  [id number]          Height:       69.0 in Accession #:    [id number]         Weight:       193.8 lb Date of Birth:  [birth year]         BSA:          2.038 m Patient Age:    89 years           BP:           169/75 mmHg Patient Gender: M                  HR:           79 bpm. [facility] Procedure: 2D Echo, Cardiac Doppler, Color Doppler and Intracardiac            Opacification Agent Indications:   Stroke  History:       Patient has no prior history of Echocardiogram examinations.                Stroke.  Sonographer:   Milda Smart Referring      [id number] Onnie Boer Phys:  Sonographer Comments: Technically difficult study due to poor echo windows. Image acquisition challenging due to patient body habitus and Image acquisition challenging due to respiratory motion. IMPRESSIONS  1. Left ventricular ejection fraction, by estimation, is  55 to 60%. The left ventricle has normal function. The left ventricle has no regional wall motion abnormalities. Left ventricular diastolic parameters are consistent with Grade I diastolic dysfunction (impaired relaxation).  2. Right ventricular systolic function was not well visualized. The right ventricular size is normal. There is normal pulmonary artery systolic pressure.  3. The mitral valve is normal in structure. No evidence of mitral valve regurgitation. No evidence of mitral stenosis.  4. The aortic valve was not well visualized. There is mild calcification of the aortic valve. Aortic valve regurgitation is mild to moderate. Mild aortic valve stenosis. Aortic valve mean gradient measures 12.0 mmHg. Aortic valve Vmax measures 2.24 m/s.  5. The inferior vena cava is normal in size with greater than 50%  respiratory variability, suggesting right atrial pressure of 3 mmHg. Comparison(s): No prior Echocardiogram. FINDINGS  Left Ventricle: Left ventricular ejection fraction, by estimation, is 55 to 60%. The left ventricle has normal function. The left ventricle has no regional wall motion abnormalities. Definity contrast agent was given IV to delineate the left ventricular  endocardial borders. The left ventricular internal cavity size was normal in size. There is no left ventricular hypertrophy. Left ventricular diastolic parameters are consistent with Grade I diastolic dysfunction (impaired relaxation). Right Ventricle: The right ventricular size is normal. No increase in right ventricular wall thickness. Right ventricular systolic function was not well visualized. There is normal pulmonary artery systolic pressure. The tricuspid regurgitant velocity is  1.99 m/s, and with an assumed right atrial pressure of 3 mmHg, the estimated right ventricular systolic pressure is 18.8 mmHg. Left Atrium: Left atrial size was normal in size. Right Atrium: Right atrial size was normal in size. Pericardium: There is no evidence of pericardial effusion. Mitral Valve: The mitral valve is normal in structure. No evidence of mitral valve regurgitation. No evidence of mitral valve stenosis. Tricuspid Valve: The tricuspid valve is normal in structure. Tricuspid valve regurgitation is trivial. No evidence of tricuspid stenosis. Aortic Valve: The aortic valve was not well visualized. There is mild calcification of the aortic valve. Aortic valve regurgitation is mild to moderate. Mild aortic stenosis is present. Aortic valve mean gradient measures 12.0 mmHg. Aortic valve peak gradient measures 20.1 mmHg. Aortic valve area, by VTI measures 1.27 cm. Pulmonic Valve: The pulmonic valve was not well visualized. Pulmonic valve regurgitation is not visualized. No evidence of pulmonic stenosis. Aorta: The aortic root and ascending aorta are  structurally normal, with no evidence of dilitation. Venous: The inferior vena cava is normal in size with greater than 50% respiratory variability, suggesting right atrial pressure of 3 mmHg. IAS/Shunts: The interatrial septum was not well visualized.  LEFT VENTRICLE PLAX 2D LVIDd:         5.30 cm   Diastology LVIDs:         3.80 cm   LV e' medial:    2.82 cm/s LV PW:         1.10 cm   LV E/e' medial:  12.3 LV IVS:        0.90 cm   LV e' lateral:   3.57 cm/s LVOT diam:     2.00 cm   LV E/e' lateral: 9.7 LV SV:         52 LV SV Index:   25 LVOT Area:     3.14 cm  RIGHT VENTRICLE RV S prime:     8.52 cm/s TAPSE (M-mode): 2.3 cm LEFT ATRIUM  Index        RIGHT ATRIUM           Index LA diam:        4.20 cm 2.06 cm/m   RA Area:     12.80 cm LA Vol (A2C):   53.4 ml 26.20 ml/m  RA Volume:   27.20 ml  13.34 ml/m LA Vol (A4C):   47.5 ml 23.30 ml/m LA Biplane Vol: 50.5 ml 24.77 ml/m  AORTIC VALVE AV Area (Vmax):    1.25 cm AV Area (Vmean):   1.19 cm AV Area (VTI):     1.27 cm AV Vmax:           224.00 cm/s AV Vmean:          167.000 cm/s AV VTI:            0.409 m AV Peak Grad:      20.1 mmHg AV Mean Grad:      12.0 mmHg LVOT Vmax:         88.80 cm/s LVOT Vmean:        63.100 cm/s LVOT VTI:          0.165 m LVOT/AV VTI ratio: 0.40  AORTA Ao Root diam: 3.40 cm Ao Asc diam:  3.40 cm MITRAL VALVE                TRICUSPID VALVE MV Area (PHT): 3.13 cm     TR Peak grad:   15.8 mmHg MV Decel Time: 242 msec     TR Vmax:        199.00 cm/s MV E velocity: 34.70 cm/s MV A velocity: 103.00 cm/s  SHUNTS MV E/A ratio:  0.34         Systemic VTI:  0.16 m                             Systemic Diam: 2.00 cm Vishnu Priya Mallipeddi Electronically signed by Winfield Rast Mallipeddi Signature Date/Time: 2022-08-06/2:54:40 PM    Final    US Carotid Bilateral (at [facility] and [facility] only)  Result Date: 08/06/2022 CLINICAL DATA:  CVA.  Transient left lower extremity weakness. EXAM: BILATERAL CAROTID DUPLEX ULTRASOUND TECHNIQUE: Wallace Cullens  scale imaging, color Doppler and duplex ultrasound were performed of bilateral carotid and vertebral arteries in the neck. COMPARISON:  None Available. FINDINGS: Criteria: Quantification of carotid stenosis is based on velocity parameters that correlate the residual internal carotid diameter with NASCET-based stenosis levels, using the diameter of the distal internal carotid lumen as the denominator for stenosis measurement. The following velocity measurements were obtained: RIGHT ICA: 68/22 cm/sec CCA: 68/7 cm/sec SYSTOLIC ICA/CCA RATIO:  1.0 ECA: 102 cm/sec LEFT ICA: 40/7 cm/sec CCA: 74/7 cm/sec SYSTOLIC ICA/CCA RATIO:  0.5 ECA: 132 cm/sec RIGHT CAROTID ARTERY: There is no grayscale evidence of significant intimal thickening or atherosclerotic plaque affecting the interrogated portions of the right carotid system. There are no elevated peak systolic velocities within the interrogated course of the right internal carotid artery to suggest a hemodynamically significant stenosis. RIGHT VERTEBRAL ARTERY:  Antegrade flow LEFT CAROTID ARTERY: There is a minimal amount of eccentric echogenic plaque within left carotid bulb (images 48 and 50), extending to involve the origin and proximal aspects of the left internal carotid artery (image 58), not resulting in elevated peak systolic velocities within the interrogated course of the left internal carotid artery to suggest a hemodynamically significant stenosis. LEFT VERTEBRAL ARTERY:  Antegrade  flow IMPRESSION: 1. Minimal amount of left-sided atherosclerotic plaque, not resulting in a hemodynamically significant stenosis. 2. Unremarkable sonographic evaluation of the right carotid system. Electronically Signed   By: Simonne Come M.D.   On: 08/20/2022 14:01    Pertinent labs & imaging results that were available during my care of the patient were reviewed by me and considered in my medical decision making (see MDM for details).  Medications Ordered in ED Medications - No  data to display                                                                                                                                   Procedures Procedures  (including critical care time)  Medical Decision Making / ED Course    Medical Decision Making:    SHEFFIELD MIGLIORE is a 89 y.o. male ***. The complaint involves an extensive differential diagnosis and also carries with it a high risk of complications and morbidity.  Serious etiology was considered. Ddx includes but is not limited to: ***  Complete initial physical exam performed, notably the patient  was ***.    Reviewed and confirmed nursing documentation for past medical history, family history, social history.  Vital signs reviewed.        Additional history obtained: -Additional history obtained from {wsadditionalhistorian:28072} -External records from outside source obtained and reviewed including: Chart review including previous notes, labs, imaging, consultation notes including ***   Lab Tests: -I ordered, reviewed, and interpreted labs.   The pertinent results include:   Labs Reviewed  CBG MONITORING, ED - Abnormal; Notable for the following components:      Result Value   Glucose-Capillary 187 (*)    All other components within normal limits  ETHANOL  PROTIME-INR  APTT  CBC  DIFFERENTIAL  COMPREHENSIVE METABOLIC PANEL  RAPID URINE DRUG SCREEN, HOSP PERFORMED  URINALYSIS, ROUTINE W REFLEX MICROSCOPIC  I-STAT CHEM 8, ED    Notable for ***  EKG   EKG Interpretation  Date/Time:    Ventricular Rate:    PR Interval:    QRS Duration:   QT Interval:    QTC Calculation:   R Axis:     Text Interpretation:           Imaging Studies ordered: I ordered imaging studies including *** I independently visualized the following imaging with scope of interpretation limited to determining acute life threatening conditions related to emergency care; findings noted above, significant for *** I  independently visualized and interpreted imaging. I agree with the radiologist interpretation   Medicines ordered and prescription drug management: No orders of the defined types were placed in this encounter.   -I have reviewed the patients home medicines and have made adjustments as needed   Consultations Obtained: I requested consultation with the ***,  and discussed lab and imaging findings as well as pertinent plan - they recommend: ***   Cardiac Monitoring: The  patient was maintained on a cardiac monitor.  I personally viewed and interpreted the cardiac monitored which showed an underlying rhythm of: ***  Social Determinants of Health:  Diagnosis or treatment significantly limited by social determinants of health: {wssoc:28071}   Reevaluation: After the interventions noted above, I reevaluated the patient and found that they have {resolved/improved/worsened:23923::"improved"}  Co morbidities that complicate the patient evaluation  Past Medical History:  Diagnosis Date   Arthritis    Bladder stone    GERD (gastroesophageal reflux disease)    History of kidney stones    HOH (hard of hearing)    Stroke [facility]) 2008      Dispostion: Disposition decision including need for hospitalization was considered, and patient {wsdispo:28070::"discharged from emergency department."}    Final Clinical Impression(s) / ED Diagnoses Final diagnoses:  None     This chart was dictated using voice recognition software.  Despite best efforts to proofread,  errors can occur which can change the documentation meaning.

## 2022-07-24 NOTE — Care Management Important Message (Signed)
Important Message  Patient Details  Name: Devon Davis MRN: [id number] Date of Birth: January 20, 1922   Medicare Important Message Given:  N/A - LOS <3 / Initial given by admissions     Corey Harold 2022-08-02, 9:58 AM

## 2022-07-24 NOTE — Progress Notes (Signed)
Pt discharged to home. Dc instructions given with caregiver at bedside. No concerns voiced. Pt and caregiver reminded of discharge meds at pt's pharmacy that need to be picked up. Voiced understanding. Pt left unit in wheelchair pushed bt this Clinical research associate. Left in stable condition.

## 2022-07-24 NOTE — Evaluation (Signed)
Speech Language Pathology Evaluation Patient Details Name: Devon Davis MRN: [id number] DOB: 04-19-1921 Today's Date: August 22, 2022 Time: 4098-1191 SLP Time Calculation (min) (ACUTE ONLY): 35 min  Problem List:  Patient Active Problem List   Diagnosis Date Noted   Acute CVA (cerebrovascular accident) (HCC) 07/23/2022   GERD (gastroesophageal reflux disease) 07/23/2022   Bladder stones 07/26/2020   Past Medical History:  Past Medical History:  Diagnosis Date   Arthritis    Bladder stone    GERD (gastroesophageal reflux disease)    History of kidney stones    HOH (hard of hearing)    Stroke (HCC) 2008   Past Surgical History:  Past Surgical History:  Procedure Laterality Date   BACK SURGERY     Dr Renaye Rakers   CYSTOSCOPY WITH LITHOLAPAXY N/A 07/26/2020   Procedure: CYSTOSCOPY WITH LITHOLAPAXY AND FULGERATION;  Surgeon: Bjorn Pippin, MD;  Location: [facility];  Service: Urology;  Laterality: N/A;   LITHOTRIPSY     PROSTATE BIOPSY N/A 07/26/2020   Procedure: BIOPSY TRANSRECTAL ULTRASONIC PROSTATE (TUBP);  Surgeon: Bjorn Pippin, MD;  Location: [facility];  Service: Urology;  Laterality: N/A;   HPI:  Devon Davis is a 89 y.o. male with medical history significant for Stroke,kidney stone. Patient presented to the ED with complaints of dizziness and weakness.  Patient reports today he went to the post office with his caregiver, when he got back, he had some dizziness so weak he could not stand.  He is unaware of any focal deficits or 1 extremity being more weak than the other, no facial asymmetry was mentioned.  At baseline he ambulates with a walker.  Patient lives alone and has a caregiver that assists him.  He reports chronic speech and memory difficulties that have progressed slowly over time, but he does not remember any acute changes.  Speech language evaluation requested.   Assessment / Plan / Recommendation Clinical Impression  Speech language evaluation completed while Pt was sitting  upright in chair with caregiver, [name] present for evaluation. Pt presents with mild dysarthria with speech intelligibility slightly reduced (75-100% intelligible). Over articulation and slowed rate are effective strategies for improving intelligibility. Pt's caregiver reports some cognitive impairment (primarily memory deficit) at baseline. Pt demonstrated decreased short term memory as evidenced by repeating basic information to SLP 2+ times during the evaluation. Pt demonstrates good awareness reporting "I'm going to need someone to stay with me at night now". Pt's cognitive impairment noted with memory and thought organization may be baseline or slightly worse than baseline. Pt further reports he did not sleep at all which is likely negatively impacting his speech and cognitive functioning. Recommend HH ST to evaluate and treat as indicated for dysarthria and cognitive impairment. There are no further ST needs noted while in acute care, ST will sign off at this time, thank you for this referral.    SLP Assessment  SLP Recommendation/Assessment: All further Speech Lanaguage Pathology  needs can be addressed in the next venue of care SLP Visit Diagnosis: Dysarthria and anarthria (R47.1);Cognitive communication deficit (R41.841)    Recommendations for follow up therapy are one component of a multi-disciplinary discharge planning process, led by the attending physician.  Recommendations may be updated based on patient status, additional functional criteria and insurance authorization.    Follow Up Recommendations  Home health SLP    Assistance Recommended at Discharge  Frequent or constant Supervision/Assistance  Functional Status Assessment    Frequency and Duration min 2x/week  2 weeks  SLP Evaluation Cognition  Overall Cognitive Status: Within Functional Limits for tasks assessed Arousal/Alertness: Awake/alert Orientation Level: Oriented X4 Attention: Focused Focused Attention:  Appears intact Focused Attention Impairment: Verbal basic Memory: Impaired Memory Impairment: Decreased short term memory Decreased Short Term Memory: Verbal basic Awareness: Appears intact Problem Solving: Appears intact Executive Function: Reasoning Reasoning: Appears intact       Comprehension  Auditory Comprehension Overall Auditory Comprehension: Appears within functional limits for tasks assessed Visual Recognition/Discrimination Discrimination: Not tested Reading Comprehension Reading Status: Not tested    Expression Expression Primary Mode of Expression: Verbal Verbal Expression Overall Verbal Expression: Appears within functional limits for tasks assessed   Oral / Motor  Oral Motor/Sensory Function Overall Oral Motor/Sensory Function: Mild impairment Facial ROM: Reduced left Facial Symmetry: Abnormal symmetry left Facial Strength: Reduced left Motor Speech Overall Motor Speech: Impaired Articulation: Impaired Level of Impairment: Word Intelligibility: Intelligibility reduced Word: 75-100% accurate            Amberlyn Martinezgarcia H. Romie Levee, CCC-SLP Speech Language Pathologist  Georgetta Haber 2022-08-14, 4:00 PM

## 2022-07-24 NOTE — TOC Benefit Eligibility Note (Signed)
Patient Advocate Encounter  Insurance verification completed.    The patient is currently admitted and upon discharge could be taking Brilinta 90 mg.  The current 30 day co-pay is $40.00.   The patient is insured through [employer] Part D   This test claim was processed through [facility]- copay amounts may vary at other pharmacies due to pharmacy/plan contracts, or as the patient moves through the different stages of their insurance plan.  Deshonda Cryderman, CPHT Pharmacy Patient Advocate Specialist [facility] Patient Advocate Team Direct Number: [phone]  Fax: [phone]       

## 2022-07-24 NOTE — Progress Notes (Signed)
   07/23/22 2214  Provider Notification  Provider Name/Title Dr. Adefesso  Date Provider Notified 07/23/22  Time Provider Notified 2214  Method of Notification Page (secure chat)  Notification Reason Other (Comment) (elvated BP 183/75)  Provider response No new orders  Date of Provider Response 07/23/22  Time of Provider Response 2220    

## 2022-07-24 NOTE — Evaluation (Signed)
Physical Therapy Evaluation Patient Details Name: Devon Davis MRN: [id number] DOB: 1921-11-14 Today's Date: 2022-08-04  History of Present Illness  Devon Davis is a 89 y.o. male with medical history significant for Stroke,kidney stone. Patient presented to the ED with complaints of dizziness and weakness.  Patient reports today he [place] the [place] office [place] his caregiver, when he got back, he had some dizziness so weak he could not stand.  He is unaware of any focal deficits or 1 extremity being more weak than the other, no facial asymmetry was mentioned.  At baseline he ambulates with a walker.  Patient lives alone and has a caregiver that assists him.  He reports chronic speech and memory difficulties that have progressed slowly over time, but he does not remember any acute changes.  Reports chronic urinary frequency, unchanged, no dysuria.  No fevers no chills.  No vomiting no loose stools.   Clinical Impression  Patient demonstrates labored movement for completing bed mobility with HOB flat, once seated had occasional falling backwards, but able to keep trunk in midline after verbal/tactile cueing, tolerated walked in room/hallway with flexed trunk and minor decreased in left ankle dorsiflexion without loss of balance.  Patient tolerated staying up in chair after therapy.  Patient will benefit from continued skilled physical therapy in hospital and recommended venue below to increase strength, balance, endurance for safe ADLs and gait.         Recommendations for follow up therapy are one component of a multi-disciplinary discharge planning process, led by the attending physician.  Recommendations may be updated based on patient status, additional functional criteria and insurance authorization.  Follow Up Recommendations       Assistance Recommended at Discharge Set up Supervision/Assistance  Patient can return home with the following  A little help with walking and/or  transfers;A little help with bathing/dressing/bathroom;Help with stairs or ramp for entrance;Assistance with cooking/housework    Equipment Recommendations None recommended by PT  Recommendations for Other Services       Functional Status Assessment Patient has had a recent decline in their functional status and demonstrates the ability to make significant improvements in function in a reasonable and predictable amount of time.     Precautions / Restrictions Precautions Precautions: Fall Restrictions Weight Bearing Restrictions: No      Mobility  Bed Mobility Overal bed mobility: Needs Assistance Bed Mobility: Supine to Sit, Sit to Supine     Supine to sit: Min guard Sit to supine: Min guard   General bed mobility comments: increased time with labored movement    Transfers Overall transfer level: Needs assistance Equipment used: Rolling walker (2 wheels) Transfers: Sit to/from Stand, Bed to chair/wheelchair/BSC Sit to Stand: Min assist   Step pivot transfers: Min guard, Min assist       General transfer comment: unsteady labored movement    Ambulation/Gait Ambulation/Gait assistance: Min guard, Min assist Gait Distance (Feet): 50 Feet Assistive device: Rolling walker (2 wheels) Gait Pattern/deviations: Decreased step length - right, Decreased step length - left, Decreased stride length, Trunk flexed Gait velocity: decreased     General Gait Details: slow slightly labored cadence without loss of balance, limited mostly due to fatigue  Stairs            Wheelchair Mobility    Modified Rankin (Stroke Patients Only)       Balance Overall balance assessment: Needs assistance Sitting-balance support: Feet supported, No upper extremity supported Sitting balance-Leahy Scale: Poor Sitting balance -  Comments: fair/poor seated at EOB Postural control: Posterior lean Standing balance support: Reliant on assistive device for balance, During functional  activity, Bilateral upper extremity supported Standing balance-Leahy Scale: Fair Standing balance comment: using RW                             Pertinent Vitals/Pain Pain Assessment Pain Assessment: Faces Faces Pain Scale: No hurt    Home Living Family/patient expects to be discharged to:: Private residence Living Arrangements: Alone Available Help at Discharge: Personal care attendant;Available PRN/intermittently Type of Home: House Home Access: Other (comment) (Unsure. Pt very hard of hearing and did not clearly answer.)       Home Layout: Laundry or work area in basement;Able to live on main level with bedroom/bathroom Home Equipment: Rollator (4 wheels);BSC/3in1;Shower seat Additional Comments: Pt reports that his home is handicapped. Has care givers prsent every day for a fluctuating amount of time.    Prior Function Prior Level of Function : Needs assist       Physical Assist : Mobility (physical);ADLs (physical) Mobility (physical): Bed mobility;Transfers;Gait;Stairs ADLs (physical): IADLs Mobility Comments: [place] ambulator with rollator. ADLs Comments: Independent with ADL's. Assisted by care attendants for IADL's.     Hand Dominance   Dominant Hand: Right    Extremity/Trunk Assessment   Upper Extremity Assessment Upper Extremity Assessment: Defer to OT evaluation RUE Deficits / Details: Pt reports decreased dexterity in ulnar side of hand in 3rd through 5th digits. RUE Coordination: decreased fine motor (D) LUE Coordination: decreased fine motor    Lower Extremity Assessment Lower Extremity Assessment: Generalized weakness;LLE deficits/detail LLE Deficits / Details: grossly 4/5 except left ankle dorsiflexion -4/5 LLE Sensation: WNL LLE Coordination: WNL    Cervical / Trunk Assessment Cervical / Trunk Assessment: Kyphotic  Communication   Communication: HOH  Cognition Arousal/Alertness: Awake/alert Behavior During Therapy: WFL for  tasks assessed/performed Overall Cognitive Status: Within Functional Limits for tasks assessed                                          General Comments      Exercises     Assessment/Plan    PT Assessment Patient needs continued PT services  PT Problem List Decreased strength;Decreased activity tolerance;Decreased balance;Decreased mobility       PT Treatment Interventions DME instruction;Gait training;Stair training;Functional mobility training;Therapeutic activities;Therapeutic exercise;Patient/family education;Balance training    PT Goals (Current goals can be found in the Care Plan section)  Acute Rehab PT Goals Patient Stated Goal: return home with caregivers to assist PT Goal Formulation: With patient Time For Goal Achievement: 07/27/22 Potential to Achieve Goals: Good    Frequency Min 4X/week     Co-evaluation PT/OT/SLP Co-Evaluation/Treatment: Yes Reason for Co-Treatment: To address functional/ADL transfers PT goals addressed during session: Mobility/safety with mobility;Balance;Proper use of DME OT goals addressed during session: ADL's and self-care       AM-PAC PT "6 Clicks" Mobility  Outcome Measure Help needed turning from your back to your side while in a flat bed without using bedrails?: None Help needed moving from lying on your back to sitting on the side of a flat bed without using bedrails?: A Little Help needed moving to and from a bed to a chair (including a wheelchair)?: A Little Help needed standing up from a chair using your arms (e.g., wheelchair or bedside  chair)?: A Little Help needed to walk in hospital room?: A Little Help needed climbing 3-5 steps with a railing? : A Lot 6 Click Score: 18    End of Session   Activity Tolerance: Patient tolerated treatment well;Patient limited by fatigue Patient left: in chair;with call bell/phone within reach Nurse Communication: Mobility status PT Visit Diagnosis: Unsteadiness on  feet (R26.81);Other abnormalities of gait and mobility (R26.89);Muscle weakness (generalized) (M62.81)    Time: 4098-1191 PT Time Calculation (min) (ACUTE ONLY): 31 min   Charges:   PT Evaluation $PT Eval Moderate Complexity: 1 Mod PT Treatments $Therapeutic Activity: 23-37 mins        12:09 PM, 08-16-2022 Ocie Bob, MPT Physical Therapist with [facility] [phone] office [phone] mobile phone

## 2022-07-24 NOTE — Care Management CC44 (Signed)
Condition Code 44 Documentation Completed  Patient Details  Name: Devon Davis MRN: [id number] Date of Birth: [birth year]   Condition Code 44 given:  Yes Patient signature on Condition Code 44 notice:  Yes Documentation of 2 MD's agreement:  Yes Code 44 added to claim:  Yes    Elliot Gault, LCSW 08/15/2022, 3:49 PM

## 2022-07-24 NOTE — Discharge Summary (Signed)
Devon Davis, is a 89 y.o. male  DOB [birth year]  MRN [id number].  Admission date:  07/23/2022  Admitting Physician  Onnie Boer, MD  Discharge Date:  08/10/2022   Primary MD  Ignatius Specking, MD  Recommendations for primary care physician for things to follow:  1)The Cardiology office will arrange for 30 day Heart monitor --- you will get this in the mail-- -it will help [place] look for irregular heartbeat that may increase your risk for stroke 2)Stop Plavix/Clopidogrel for the next 30 days while you are taking Brilinta/Ticagrelor 3)Take Aspirin 81 mg once daily and Brilinta/Ticagrelor 90 mg twice daily for 1 month 4)After 1 month... Stop the Brilinta/ticagrelor and take a combination of aspirin 81 and Plavix 75 mg for 60 days And then Stop the Aspirin 90 days from now and take just Plavix/Clopidogrel 75 mg daily indefinitely 5) follow-up with neurologist Dr. Pearlean Brownie in 6 to 8 weeks for recheck 6) please note that there are several changes to your medications--- please pay attention  Admission Diagnosis  Acute CVA (cerebrovascular accident) [facility]) [I63.9] Cerebrovascular accident (CVA), unspecified mechanism (HCC) [I63.9]   Discharge Diagnosis  Acute CVA (cerebrovascular accident) (HCC) [I63.9] Cerebrovascular accident (CVA), unspecified mechanism (HCC) [I63.9]    Principal Problem:   Acute CVA (cerebrovascular accident) (HCC) Active Problems:   GERD (gastroesophageal reflux disease)      Past Medical History:  Diagnosis Date   Arthritis    Bladder stone    GERD (gastroesophageal reflux disease)    History of kidney stones    HOH (hard of hearing)    Stroke (HCC) 2008    Past Surgical History:  Procedure Laterality Date   BACK SURGERY     Dr Renaye Rakers   CYSTOSCOPY WITH LITHOLAPAXY N/A 07/26/2020   Procedure: CYSTOSCOPY WITH LITHOLAPAXY AND FULGERATION;  Surgeon: Bjorn Pippin, MD;  Location:  [facility];  Service: Urology;  Laterality: N/A;   LITHOTRIPSY     PROSTATE BIOPSY N/A 07/26/2020   Procedure: BIOPSY TRANSRECTAL ULTRASONIC PROSTATE (TUBP);  Surgeon: Bjorn Pippin, MD;  Location: [facility];  Service: Urology;  Laterality: N/A;      HPI  from the history and physical done on the day of admission:   Chief Complaint: Weakness,Dizziness   HPI: Devon Davis is a 89 y.o. male with medical history significant for Stroke,kidney stone. Patient presented to the ED with complaints of dizziness and weakness.  Patient reports today he went to the post office with his caregiver, when he got back, he had some dizziness so weak he could not stand.  He is unaware of any focal deficits or 1 extremity being more weak than the other, no facial asymmetry was mentioned.  At baseline he ambulates with a walker.  Patient lives alone and has a caregiver that assists him.  He reports chronic speech and memory difficulties that have progressed slowly over time, but he does not remember any acute changes. Reports chronic urinary frequency, unchanged, no dysuria.  No fevers no chills.  No  vomiting no loose stools.   ED Course: Blood pressure elevated systolic 175 to 191.  MRI  Brain,MRA head - Small acute to early subacute infarct in the right lentiform nucleus, and additional subacute appearing cortical infarct along the undersurface of the left occipital lobe. 2. Occluded left A1 segment and multifocal high-grade stenosis of the right A2 and A3 segments. 3. Multifocal high-grade stenosis/occlusion of the left P2 segment with no flow related enhancement in the distal PCA branches. 4. Occlusion of the superior right P2/P3 segment. EDP talked to Neurology- ok to admit here for stroke workup.   Review of Systems: As per HPI all other systems reviewed and negative.    Hospital Course:     No notes on file  ***** Assessment and Plan: * Acute CVA (cerebrovascular accident) (HCC) Presenting with dizziness  and weakness unable to ambulate.  On exam no focal neurologic deficits, 5 of 5 strength in all extremities.  Reports history of prior stroke 11 years ago- " he was unable to write his name", he has been on Plavix since then and reports compliance.  Mri brain today shows small acute to early subacute infarct right lentiform nucleus, subacute infarct undersurface of left occipital lobe.  MRA multifocal high-grade stenosis of multiple arteries (See detailed report) - EDP to neurology, okay to admit here at [facility] -Continue Plavix, will start aspirin 325 mg x 1, continue 81 mg daily -Start atorvastatin 40 mg daily -Lipid panel, hemoglobin A1c in a.m. -Echocardiogram -Carotid Dopplers - 1 LR bolus given, ambulate in a.m. to check for persistent dizziness -UA with trace leukocytes, he denies urinary symptoms.  Hold off on antibiotics  GERD (gastroesophageal reflux disease) Resume Protonix.        Discharge Condition: ***  Follow UP   Follow-up Information     Care, [facility] Follow up.   Specialty: Home Health Services Why: [facility] staff will contact you or [name] to schedule in home visits for PT and OT Contact information: [address] [place] [address] [phone]                  Consults obtained - ***  Diet and Activity recommendation:  As advised  Discharge Instructions    **** Discharge Instructions     Call MD for:  difficulty breathing, headache or visual disturbances   Complete by: As directed    Call MD for:  persistant dizziness or light-headedness   Complete by: As directed    Call MD for:  persistant nausea and vomiting   Complete by: As directed    Call MD for:  temperature >100.4   Complete by: As directed    Diet - low sodium heart healthy   Complete by: As directed    Discharge instructions   Complete by: As directed    1)The Cardiology office will arrange for 30 day Heart monitor --- you will get  this in the mail-- -it will help [place] look for irregular heartbeat that may increase your risk for stroke 2)Stop Plavix/Clopidogrel for the next 30 days while you are taking Brilinta/Ticagrelor 3)Take Aspirin 81 mg once daily and Brilinta/Ticagrelor 90 mg twice daily for 1 month 4)After 1 month... Stop the Brilinta/ticagrelor and take a combination of aspirin 81 and Plavix 75 mg for 60 days And then Stop the Aspirin 90 days from now and take just Plavix/Clopidogrel 75 mg daily indefinitely 5) follow-up with neurologist Dr. Pearlean Brownie in 6 to 8 weeks for  recheck 6) please note that there are several changes to your medications--- please pay attention   Increase activity slowly   Complete by: As directed    Potential Condition Code 44   Complete by: As directed          Discharge Medications     Allergies as of 07/23/2022   No Known Allergies      Medication List     TAKE these medications    acetaminophen 325 MG tablet Commonly known as: TYLENOL Take 2 tablets (650 mg total) by mouth every 4 (four) hours as needed for mild pain, headache or fever (or temp > 37.5 C (99.5 F)). What changed:  medication strength how much to take when to take this reasons to take this   aspirin EC 81 MG tablet Take 1 tablet (81 mg total) by mouth daily with breakfast.   atorvastatin 40 MG tablet Commonly known as: LIPITOR Take 1 tablet (40 mg total) by mouth daily. Start taking on: Jul 25, 2022   CENTRUM SILVER 50+MEN PO Take 1 tablet by mouth daily.   clopidogrel 75 MG tablet Commonly known as: PLAVIX Take 1 tablet (75 mg total) by mouth daily. Start taking on: August 24, 2022 What changed:  when to take this These instructions start on August 24, 2022. If you are unsure what to do until then, ask your doctor or other care provider.   pantoprazole 40 MG tablet Commonly known as: PROTONIX Take 1 tablet (40 mg total) by mouth daily.   ticagrelor 90 MG Tabs tablet Commonly known as:  Brilinta Take 1 tablet (90 mg total) by mouth 2 (two) times daily. For 1 month only        Major procedures and Radiology Reports - PLEASE review detailed and final reports for all details, in brief -   ***  ECHOCARDIOGRAM COMPLETE  Result Date: 07/30/2022    ECHOCARDIOGRAM REPORT   Patient Name:   Devon Davis Date of Exam: 08/05/2022 Medical Rec #:  [id number]          Height:       69.0 in Accession #:    [id number]         Weight:       193.8 lb Date of Birth:  [birth year]         BSA:          2.038 m Patient Age:    89 years           BP:           169/75 mmHg Patient Gender: M                  HR:           79 bpm. [facility] Procedure: 2D Echo, Cardiac Doppler, Color Doppler and Intracardiac            Opacification Agent Indications:   Stroke  History:       Patient has no prior history of Echocardiogram examinations.                Stroke.  Sonographer:   Milda Smart Referring      [id number] Onnie Boer Phys:  Sonographer Comments: Technically difficult study due to poor echo windows. Image acquisition challenging due to patient body habitus and Image acquisition challenging due to respiratory motion. IMPRESSIONS  1. Left ventricular ejection fraction, by estimation, is 55 to 60%. The left ventricle has  normal function. The left ventricle has no regional wall motion abnormalities. Left ventricular diastolic parameters are consistent with Grade I diastolic dysfunction (impaired relaxation).  2. Right ventricular systolic function was not well visualized. The right ventricular size is normal. There is normal pulmonary artery systolic pressure.  3. The mitral valve is normal in structure. No evidence of mitral valve regurgitation. No evidence of mitral stenosis.  4. The aortic valve was not well visualized. There is mild calcification of the aortic valve. Aortic valve regurgitation is mild to moderate. Mild aortic valve stenosis. Aortic valve mean gradient measures 12.0  mmHg. Aortic valve Vmax measures 2.24 m/s.  5. The inferior vena cava is normal in size with greater than 50% respiratory variability, suggesting right atrial pressure of 3 mmHg. Comparison(s): No prior Echocardiogram. FINDINGS  Left Ventricle: Left ventricular ejection fraction, by estimation, is 55 to 60%. The left ventricle has normal function. The left ventricle has no regional wall motion abnormalities. Definity contrast agent was given IV to delineate the left ventricular  endocardial borders. The left ventricular internal cavity size was normal in size. There is no left ventricular hypertrophy. Left ventricular diastolic parameters are consistent with Grade I diastolic dysfunction (impaired relaxation). Right Ventricle: The right ventricular size is normal. No increase in right ventricular wall thickness. Right ventricular systolic function was not well visualized. There is normal pulmonary artery systolic pressure. The tricuspid regurgitant velocity is  1.99 m/s, and with an assumed right atrial pressure of 3 mmHg, the estimated right ventricular systolic pressure is 18.8 mmHg. Left Atrium: Left atrial size was normal in size. Right Atrium: Right atrial size was normal in size. Pericardium: There is no evidence of pericardial effusion. Mitral Valve: The mitral valve is normal in structure. No evidence of mitral valve regurgitation. No evidence of mitral valve stenosis. Tricuspid Valve: The tricuspid valve is normal in structure. Tricuspid valve regurgitation is trivial. No evidence of tricuspid stenosis. Aortic Valve: The aortic valve was not well visualized. There is mild calcification of the aortic valve. Aortic valve regurgitation is mild to moderate. Mild aortic stenosis is present. Aortic valve mean gradient measures 12.0 mmHg. Aortic valve peak gradient measures 20.1 mmHg. Aortic valve area, by VTI measures 1.27 cm. Pulmonic Valve: The pulmonic valve was not well visualized. Pulmonic valve  regurgitation is not visualized. No evidence of pulmonic stenosis. Aorta: The aortic root and ascending aorta are structurally normal, with no evidence of dilitation. Venous: The inferior vena cava is normal in size with greater than 50% respiratory variability, suggesting right atrial pressure of 3 mmHg. IAS/Shunts: The interatrial septum was not well visualized.  LEFT VENTRICLE PLAX 2D LVIDd:         5.30 cm   Diastology LVIDs:         3.80 cm   LV e' medial:    2.82 cm/s LV PW:         1.10 cm   LV E/e' medial:  12.3 LV IVS:        0.90 cm   LV e' lateral:   3.57 cm/s LVOT diam:     2.00 cm   LV E/e' lateral: 9.7 LV SV:         52 LV SV Index:   25 LVOT Area:     3.14 cm  RIGHT VENTRICLE RV S prime:     8.52 cm/s TAPSE (M-mode): 2.3 cm LEFT ATRIUM             Index  RIGHT ATRIUM           Index LA diam:        4.20 cm 2.06 cm/m   RA Area:     12.80 cm LA Vol (A2C):   53.4 ml 26.20 ml/m  RA Volume:   27.20 ml  13.34 ml/m LA Vol (A4C):   47.5 ml 23.30 ml/m LA Biplane Vol: 50.5 ml 24.77 ml/m  AORTIC VALVE AV Area (Vmax):    1.25 cm AV Area (Vmean):   1.19 cm AV Area (VTI):     1.27 cm AV Vmax:           224.00 cm/s AV Vmean:          167.000 cm/s AV VTI:            0.409 m AV Peak Grad:      20.1 mmHg AV Mean Grad:      12.0 mmHg LVOT Vmax:         88.80 cm/s LVOT Vmean:        63.100 cm/s LVOT VTI:          0.165 m LVOT/AV VTI ratio: 0.40  AORTA Ao Root diam: 3.40 cm Ao Asc diam:  3.40 cm MITRAL VALVE                TRICUSPID VALVE MV Area (PHT): 3.13 cm     TR Peak grad:   15.8 mmHg MV Decel Time: 242 msec     TR Vmax:        199.00 cm/s MV E velocity: 34.70 cm/s MV A velocity: 103.00 cm/s  SHUNTS MV E/A ratio:  0.34         Systemic VTI:  0.16 m                             Systemic Diam: 2.00 cm Vishnu Priya Mallipeddi Electronically signed by Winfield Rast Mallipeddi Signature Date/Time: 08/16/2022/2:54:40 PM    Final    US Carotid Bilateral (at [facility] and [facility] only)  Result Date:  08-16-22 CLINICAL DATA:  CVA.  Transient left lower extremity weakness. EXAM: BILATERAL CAROTID DUPLEX ULTRASOUND TECHNIQUE: Wallace Cullens scale imaging, color Doppler and duplex ultrasound were performed of bilateral carotid and vertebral arteries in the neck. COMPARISON:  None Available. FINDINGS: Criteria: Quantification of carotid stenosis is based on velocity parameters that correlate the residual internal carotid diameter with NASCET-based stenosis levels, using the diameter of the distal internal carotid lumen as the denominator for stenosis measurement. The following velocity measurements were obtained: RIGHT ICA: 68/22 cm/sec CCA: 68/7 cm/sec SYSTOLIC ICA/CCA RATIO:  1.0 ECA: 102 cm/sec LEFT ICA: 40/7 cm/sec CCA: 74/7 cm/sec SYSTOLIC ICA/CCA RATIO:  0.5 ECA: 132 cm/sec RIGHT CAROTID ARTERY: There is no grayscale evidence of significant intimal thickening or atherosclerotic plaque affecting the interrogated portions of the right carotid system. There are no elevated peak systolic velocities within the interrogated course of the right internal carotid artery to suggest a hemodynamically significant stenosis. RIGHT VERTEBRAL ARTERY:  Antegrade flow LEFT CAROTID ARTERY: There is a minimal amount of eccentric echogenic plaque within left carotid bulb (images 48 and 50), extending to involve the origin and proximal aspects of the left internal carotid artery (image 58), not resulting in elevated peak systolic velocities within the interrogated course of the left internal carotid artery to suggest a hemodynamically significant stenosis. LEFT VERTEBRAL ARTERY:  Antegrade flow IMPRESSION: 1. Minimal amount of left-sided atherosclerotic  plaque, not resulting in a hemodynamically significant stenosis. 2. Unremarkable sonographic evaluation of the right carotid system. Electronically Signed   By: Simonne Come M.D.   On: 07/31/2022 14:01   MR BRAIN WO CONTRAST  Result Date: 07/23/2022 CLINICAL DATA:  TIA EXAM: MRI HEAD  WITHOUT CONTRAST MRA HEAD WITHOUT CONTRAST TECHNIQUE: Multiplanar, multi-echo pulse sequences of the brain and surrounding structures were acquired without intravenous contrast. Angiographic images of the Circle of Willis were acquired using MRA technique without intravenous contrast. COMPARISON:  None Available. FINDINGS: MRI HEAD FINDINGS Brain: There is a small focus of diffusion restriction in the right lentiform nucleus with associated FLAIR signal abnormality consistent with acute to early subacute infarct. There is no hemorrhage or mass effect. There is additional curvilinear diffusion restriction with FLAIR signal abnormality along the undersurface of the left occipital lobe consistent with additional infarct which appears subacute in chronicity. There is no acute intracranial hemorrhage or extra-axial fluid collection. Parenchymal volume is within expected limits for age. The ventricles are normal in size. Patchy FLAIR signal abnormality throughout the remainder of the supratentorial white matter likely reflects sequela of moderate chronic small-vessel ischemic change. The pituitary and suprasellar region are normal. There is no mass lesion. There is no mass effect or midline shift. Vascular: The major flow voids are normal. The vasculature is assessed in full below. Skull and upper cervical spine: Normal marrow signal. Sinuses/Orbits: The paranasal sinuses are clear. Bilateral lens implants are in place. The globes and orbits are otherwise unremarkable. Other: None. MRA HEAD FINDINGS Anterior circulation: The intracranial ICAs are patent with atherosclerotic irregularity resulting in mild-to-moderate stenosis, left worse than right. The bilateral MCAs are patent, without proximal high-grade stenosis or occlusion. The left A1 segment is occluded. The right A1 segment is normal. The anterior communicating artery is normal. The distal ACA branches appear patent but with multifocal high-grade stenoses of the  right A2 and A3 segments. There is no aneurysm or AVM. Posterior circulation: The V4 segments are not included within the field of view. The basilar artery is patent. There is multifocal high-grade stenosis/occlusion of the left P2 segment with no flow related enhancement in the distal PCA branches. The right P1 segment is patent there is occlusion of a superior right P2/P3 segment after a bifurcation. There is no aneurysm or AVM. Anatomic variants: None. IMPRESSION: 1. Small acute to early subacute infarct in the right lentiform nucleus, and additional subacute appearing cortical infarct along the undersurface of the left occipital lobe. 2. Occluded left A1 segment and multifocal high-grade stenosis of the right A2 and A3 segments. 3. Multifocal high-grade stenosis/occlusion of the left P2 segment with no flow related enhancement in the distal PCA branches. 4. Occlusion of the superior right P2/P3 segment. Electronically Signed   By: Lesia Hausen M.D.   On: 07/23/2022 14:31   MR ANGIO HEAD WO CONTRAST  Result Date: 07/23/2022 CLINICAL DATA:  TIA EXAM: MRI HEAD WITHOUT CONTRAST MRA HEAD WITHOUT CONTRAST TECHNIQUE: Multiplanar, multi-echo pulse sequences of the brain and surrounding structures were acquired without intravenous contrast. Angiographic images of the Circle of Willis were acquired using MRA technique without intravenous contrast. COMPARISON:  None Available. FINDINGS: MRI HEAD FINDINGS Brain: There is a small focus of diffusion restriction in the right lentiform nucleus with associated FLAIR signal abnormality consistent with acute to early subacute infarct. There is no hemorrhage or mass effect. There is additional curvilinear diffusion restriction with FLAIR signal abnormality along the undersurface of the left occipital lobe  consistent with additional infarct which appears subacute in chronicity. There is no acute intracranial hemorrhage or extra-axial fluid collection. Parenchymal volume is within  expected limits for age. The ventricles are normal in size. Patchy FLAIR signal abnormality throughout the remainder of the supratentorial white matter likely reflects sequela of moderate chronic small-vessel ischemic change. The pituitary and suprasellar region are normal. There is no mass lesion. There is no mass effect or midline shift. Vascular: The major flow voids are normal. The vasculature is assessed in full below. Skull and upper cervical spine: Normal marrow signal. Sinuses/Orbits: The paranasal sinuses are clear. Bilateral lens implants are in place. The globes and orbits are otherwise unremarkable. Other: None. MRA HEAD FINDINGS Anterior circulation: The intracranial ICAs are patent with atherosclerotic irregularity resulting in mild-to-moderate stenosis, left worse than right. The bilateral MCAs are patent, without proximal high-grade stenosis or occlusion. The left A1 segment is occluded. The right A1 segment is normal. The anterior communicating artery is normal. The distal ACA branches appear patent but with multifocal high-grade stenoses of the right A2 and A3 segments. There is no aneurysm or AVM. Posterior circulation: The V4 segments are not included within the field of view. The basilar artery is patent. There is multifocal high-grade stenosis/occlusion of the left P2 segment with no flow related enhancement in the distal PCA branches. The right P1 segment is patent there is occlusion of a superior right P2/P3 segment after a bifurcation. There is no aneurysm or AVM. Anatomic variants: None. IMPRESSION: 1. Small acute to early subacute infarct in the right lentiform nucleus, and additional subacute appearing cortical infarct along the undersurface of the left occipital lobe. 2. Occluded left A1 segment and multifocal high-grade stenosis of the right A2 and A3 segments. 3. Multifocal high-grade stenosis/occlusion of the left P2 segment with no flow related enhancement in the distal PCA branches.  4. Occlusion of the superior right P2/P3 segment. Electronically Signed   By: Lesia Hausen M.D.   On: 07/23/2022 14:31    Micro Results   *** No results found for this or any previous visit (from the past 240 hour(s)).  Today   Subjective    Devon Davis today has no ***          Patient has been seen and examined prior to discharge   Objective   Blood pressure 136/75, pulse 81, temperature 97.8 F (36.6 C), temperature source Oral, resp. rate 17, height 5\' 9"  (1.753 m), weight 87.9 kg, SpO2 99 %.   Intake/Output Summary (Last 24 hours) at 07/27/2022 1545 Last data filed at 07-27-22 1300 Gross per 24 hour  Intake 720 ml  Output 600 ml  Net 120 ml    Exam Gen:- Awake Alert, no acute distress *** HEENT:- [place].AT, No sclera icterus Neck-Supple Neck,No JVD,.  Lungs-  CTAB , good air movement bilaterally CV- S1, S2 normal, regular Abd-  +ve B.Sounds, Abd Soft, No tenderness,    Extremity/Skin:- No  edema,   good pulses Psych-affect is appropriate, oriented x3 Neuro-no new focal deficits, no tremors ***   Data Review   CBC w Diff:  Lab Results  Component Value Date   WBC 6.6 07/23/2022   HGB 15.0 07/23/2022   HCT 45.0 07/23/2022   PLT 196 07/23/2022   LYMPHOPCT 17 07/23/2022   MONOPCT 9 07/23/2022   EOSPCT 2 07/23/2022   BASOPCT 1 07/23/2022    CMP:  Lab Results  Component Value Date   NA 138 07/23/2022   K 4.3  07/23/2022   CL 104 07/23/2022   CO2 25 07/23/2022   BUN 17 07/23/2022   CREATININE 0.83 07/23/2022   PROT 6.6 07/23/2022   ALBUMIN 3.8 07/23/2022   BILITOT 1.0 07/23/2022   ALKPHOS 68 07/23/2022   AST 19 07/23/2022   ALT 14 07/23/2022  .  Total Discharge time is about 33 minutes  Shon Hale M.D on 07/27/2022 at 3:45 PM  Go to [email or web address] -  for contact info  [facility] - Office  [phone]

## 2022-07-25 ENCOUNTER — Emergency Department (HOSPITAL_COMMUNITY): Payer: Medicare PPO

## 2022-07-25 ENCOUNTER — Encounter (HOSPITAL_COMMUNITY): Payer: Self-pay | Admitting: *Deleted

## 2022-07-25 ENCOUNTER — Other Ambulatory Visit: Payer: Self-pay

## 2022-07-25 DIAGNOSIS — Z7902 Long term (current) use of antithrombotics/antiplatelets: Secondary | ICD-10-CM | POA: Diagnosis not present

## 2022-07-25 DIAGNOSIS — R350 Frequency of micturition: Secondary | ICD-10-CM | POA: Diagnosis present

## 2022-07-25 DIAGNOSIS — Z66 Do not resuscitate: Secondary | ICD-10-CM | POA: Diagnosis present

## 2022-07-25 DIAGNOSIS — I639 Cerebral infarction, unspecified: Secondary | ICD-10-CM

## 2022-07-25 DIAGNOSIS — R4182 Altered mental status, unspecified: Secondary | ICD-10-CM | POA: Diagnosis not present

## 2022-07-25 DIAGNOSIS — Z87442 Personal history of urinary calculi: Secondary | ICD-10-CM | POA: Diagnosis not present

## 2022-07-25 DIAGNOSIS — H9190 Unspecified hearing loss, unspecified ear: Secondary | ICD-10-CM | POA: Diagnosis present

## 2022-07-25 DIAGNOSIS — I672 Cerebral atherosclerosis: Secondary | ICD-10-CM | POA: Diagnosis present

## 2022-07-25 DIAGNOSIS — E785 Hyperlipidemia, unspecified: Secondary | ICD-10-CM | POA: Diagnosis present

## 2022-07-25 DIAGNOSIS — I6330 Cerebral infarction due to thrombosis of unspecified cerebral artery: Secondary | ICD-10-CM

## 2022-07-25 DIAGNOSIS — Z515 Encounter for palliative care: Secondary | ICD-10-CM | POA: Diagnosis not present

## 2022-07-25 DIAGNOSIS — R5381 Other malaise: Secondary | ICD-10-CM | POA: Diagnosis present

## 2022-07-25 DIAGNOSIS — I700 Atherosclerosis of aorta: Secondary | ICD-10-CM | POA: Diagnosis present

## 2022-07-25 DIAGNOSIS — E119 Type 2 diabetes mellitus without complications: Secondary | ICD-10-CM | POA: Diagnosis present

## 2022-07-25 DIAGNOSIS — Z79899 Other long term (current) drug therapy: Secondary | ICD-10-CM | POA: Diagnosis not present

## 2022-07-25 DIAGNOSIS — R29810 Facial weakness: Secondary | ICD-10-CM | POA: Diagnosis present

## 2022-07-25 DIAGNOSIS — F03A11 Unspecified dementia, mild, with agitation: Secondary | ICD-10-CM | POA: Diagnosis present

## 2022-07-25 DIAGNOSIS — I6601 Occlusion and stenosis of right middle cerebral artery: Secondary | ICD-10-CM | POA: Diagnosis present

## 2022-07-25 DIAGNOSIS — Z8249 Family history of ischemic heart disease and other diseases of the circulatory system: Secondary | ICD-10-CM | POA: Diagnosis not present

## 2022-07-25 DIAGNOSIS — G8194 Hemiplegia, unspecified affecting left nondominant side: Secondary | ICD-10-CM | POA: Diagnosis present

## 2022-07-25 DIAGNOSIS — I6389 Other cerebral infarction: Secondary | ICD-10-CM | POA: Diagnosis present

## 2022-07-25 DIAGNOSIS — I10 Essential (primary) hypertension: Secondary | ICD-10-CM | POA: Diagnosis present

## 2022-07-25 DIAGNOSIS — K219 Gastro-esophageal reflux disease without esophagitis: Secondary | ICD-10-CM | POA: Diagnosis present

## 2022-07-25 DIAGNOSIS — R471 Dysarthria and anarthria: Secondary | ICD-10-CM | POA: Diagnosis present

## 2022-07-25 DIAGNOSIS — Z8673 Personal history of transient ischemic attack (TIA), and cerebral infarction without residual deficits: Secondary | ICD-10-CM | POA: Diagnosis not present

## 2022-07-25 DIAGNOSIS — M109 Gout, unspecified: Secondary | ICD-10-CM | POA: Diagnosis present

## 2022-07-25 DIAGNOSIS — Z7982 Long term (current) use of aspirin: Secondary | ICD-10-CM | POA: Diagnosis not present

## 2022-07-25 LAB — COMPREHENSIVE METABOLIC PANEL
ALT: 14 U/L (ref 0–44)
ALT: 14 U/L (ref 0–44)
AST: 17 U/L (ref 15–41)
AST: 18 U/L (ref 15–41)
Albumin: 3.2 g/dL — ABNORMAL LOW (ref 3.5–5.0)
Albumin: 3.5 g/dL (ref 3.5–5.0)
Alkaline Phosphatase: 68 U/L (ref 38–126)
Alkaline Phosphatase: 70 U/L (ref 38–126)
Anion gap: 11 (ref 5–15)
Anion gap: 5 (ref 5–15)
BUN: 12 mg/dL (ref 8–23)
BUN: 13 mg/dL (ref 8–23)
CO2: 22 mmol/L (ref 22–32)
CO2: 24 mmol/L (ref 22–32)
Calcium: 8.8 mg/dL — ABNORMAL LOW (ref 8.9–10.3)
Calcium: 9.3 mg/dL (ref 8.9–10.3)
Chloride: 103 mmol/L (ref 98–111)
Chloride: 107 mmol/L (ref 98–111)
Creatinine, Ser: 0.85 mg/dL (ref 0.61–1.24)
Creatinine, Ser: 0.96 mg/dL (ref 0.61–1.24)
GFR, Estimated: >60 mL/min (ref >60)
GFR, Estimated: >60 mL/min (ref >60)
Glucose, Bld: 146 mg/dL — ABNORMAL HIGH (ref 70–99)
Glucose, Bld: 183 mg/dL — ABNORMAL HIGH (ref 70–99)
Potassium: 3.6 mmol/L (ref 3.5–5.1)
Potassium: 3.9 mmol/L (ref 3.5–5.1)
Sodium: 136 mmol/L (ref 135–145)
Sodium: 136 mmol/L (ref 135–145)
Total Bilirubin: 0.7 mg/dL (ref 0.3–1.2)
Total Bilirubin: 0.9 mg/dL (ref 0.3–1.2)
Total Protein: 5.3 g/dL — ABNORMAL LOW (ref 6.5–8.1)
Total Protein: 6.4 g/dL — ABNORMAL LOW (ref 6.5–8.1)

## 2022-07-25 LAB — CBC
HCT: 42.8 % (ref 39.0–52.0)
Hemoglobin: 14.2 g/dL (ref 13.0–17.0)
MCH: 33.7 pg (ref 26.0–34.0)
MCHC: 33.2 g/dL (ref 30.0–36.0)
MCV: 101.7 fL — ABNORMAL HIGH (ref 80.0–100.0)
Platelets: 189 10*3/uL (ref 150–400)
RBC: 4.21 MIL/uL — ABNORMAL LOW (ref 4.22–5.81)
RDW: 13.1 % (ref 11.5–15.5)
WBC: 7.1 10*3/uL (ref 4.0–10.5)
nRBC: 0 % (ref 0.0–0.2)

## 2022-07-25 LAB — ETHANOL: Alcohol, Ethyl (B): <10 mg/dL (ref <10)

## 2022-07-25 LAB — RAPID URINE DRUG SCREEN, HOSP PERFORMED
Amphetamines: NOT DETECTED
Barbiturates: NOT DETECTED
Benzodiazepines: NOT DETECTED
Cocaine: NOT DETECTED
Opiates: NOT DETECTED
Tetrahydrocannabinol: NOT DETECTED

## 2022-07-25 LAB — I-STAT CHEM 8, ED
BUN: 14 mg/dL (ref 8–23)
Calcium, Ion: 1.15 mmol/L (ref 1.15–1.40)
Chloride: 105 mmol/L (ref 98–111)
Creatinine, Ser: 0.9 mg/dL (ref 0.61–1.24)
Glucose, Bld: 181 mg/dL — ABNORMAL HIGH (ref 70–99)
HCT: 42 % (ref 39.0–52.0)
Hemoglobin: 14.3 g/dL (ref 13.0–17.0)
Potassium: 3.6 mmol/L (ref 3.5–5.1)
Sodium: 137 mmol/L (ref 135–145)
TCO2: 22 mmol/L (ref 22–32)

## 2022-07-25 LAB — DIFFERENTIAL
Abs Immature Granulocytes: 0.02 10*3/uL (ref 0.00–0.07)
Basophils Absolute: 0.1 10*3/uL (ref 0.0–0.1)
Basophils Relative: 1 %
Eosinophils Absolute: 0.2 10*3/uL (ref 0.0–0.5)
Eosinophils Relative: 2 %
Immature Granulocytes: 0 %
Lymphocytes Relative: 13 %
Lymphs Abs: 1 10*3/uL (ref 0.7–4.0)
Monocytes Absolute: 0.8 10*3/uL (ref 0.1–1.0)
Monocytes Relative: 12 %
Neutro Abs: 5.1 10*3/uL (ref 1.7–7.7)
Neutrophils Relative %: 72 %

## 2022-07-25 LAB — URINALYSIS, ROUTINE W REFLEX MICROSCOPIC
Bilirubin Urine: NEGATIVE
Glucose, UA: NEGATIVE mg/dL
Hgb urine dipstick: NEGATIVE
Ketones, ur: NEGATIVE mg/dL
Leukocytes,Ua: NEGATIVE
Nitrite: NEGATIVE
Protein, ur: NEGATIVE mg/dL
Specific Gravity, Urine: 1.039 — ABNORMAL HIGH (ref 1.005–1.030)
pH: 5 (ref 5.0–8.0)

## 2022-07-25 LAB — LIPID PANEL
Cholesterol: 159 mg/dL (ref 0–200)
HDL: 41 mg/dL (ref >40)
LDL Cholesterol: 94 mg/dL (ref 0–99)
Total CHOL/HDL Ratio: 3.9 RATIO
Triglycerides: 119 mg/dL (ref <150)
VLDL: 24 mg/dL (ref 0–40)

## 2022-07-25 LAB — I-STAT VENOUS BLOOD GAS, ED
Acid-Base Excess: 0 mmol/L (ref 0.0–2.0)
Bicarbonate: 22.8 mmol/L (ref 20.0–28.0)
Calcium, Ion: 1.15 mmol/L (ref 1.15–1.40)
HCT: 42 % (ref 39.0–52.0)
Hemoglobin: 14.3 g/dL (ref 13.0–17.0)
O2 Saturation: 99 %
Potassium: 3.7 mmol/L (ref 3.5–5.1)
Sodium: 137 mmol/L (ref 135–145)
TCO2: 24 mmol/L (ref 22–32)
pCO2, Ven: 30.1 mmHg — ABNORMAL LOW (ref 44–60)
pH, Ven: 7.486 — ABNORMAL HIGH (ref 7.25–7.43)
pO2, Ven: 106 mmHg — ABNORMAL HIGH (ref 32–45)

## 2022-07-25 LAB — TROPONIN I (HIGH SENSITIVITY)
Troponin I (High Sensitivity): 23 ng/L — ABNORMAL HIGH (ref <18)
Troponin I (High Sensitivity): 24 ng/L — ABNORMAL HIGH (ref <18)

## 2022-07-25 LAB — BRAIN NATRIURETIC PEPTIDE: B Natriuretic Peptide: 254.1 pg/mL — ABNORMAL HIGH (ref 0.0–100.0)

## 2022-07-25 LAB — PROTIME-INR
INR: 1 (ref 0.8–1.2)
Prothrombin Time: 13.4 seconds (ref 11.4–15.2)

## 2022-07-25 LAB — APTT: aPTT: 27 seconds (ref 24–36)

## 2022-07-25 MED ORDER — SENNOSIDES-DOCUSATE SODIUM 8.6-50 MG PO TABS: 1.0000 | ORAL_TABLET | Freq: Every evening | ORAL | Status: DC | PRN

## 2022-07-25 MED ORDER — STROKE: EARLY STAGES OF RECOVERY BOOK
Freq: Once | Status: DC
  Filled 2022-07-25: qty 1

## 2022-07-25 MED ORDER — TICAGRELOR 90 MG PO TABS
90.0000 mg | ORAL_TABLET | Freq: Two times a day (BID) | ORAL | Status: DC
  Filled 2022-07-25 (×2): qty 1

## 2022-07-25 MED ORDER — SODIUM CHLORIDE 0.9 % IV BOLUS
500.0000 mL | Freq: Once | INTRAVENOUS | Status: AC
  Administered 2022-07-25: 500 mL via INTRAVENOUS

## 2022-07-25 MED ORDER — IOHEXOL 350 MG/ML SOLN
75.0000 mL | Freq: Once | INTRAVENOUS | Status: AC | PRN
  Administered 2022-07-25: 75 mL via INTRAVENOUS

## 2022-07-25 MED ORDER — ACETAMINOPHEN 325 MG PO TABS: 650.0000 mg | ORAL_TABLET | ORAL | Status: DC | PRN

## 2022-07-25 MED ORDER — STROKE: EARLY STAGES OF RECOVERY BOOK: Freq: Once | Status: DC

## 2022-07-25 MED ORDER — HALOPERIDOL LACTATE 5 MG/ML IJ SOLN
1.0000 mg | Freq: Four times a day (QID) | INTRAMUSCULAR | Status: DC | PRN
  Administered 2022-07-25 – 2022-07-27 (×3): 1 mg via INTRAVENOUS
  Filled 2022-07-25 (×4): qty 1

## 2022-07-25 MED ORDER — ACETAMINOPHEN 650 MG RE SUPP: 650.0000 mg | RECTAL | Status: DC | PRN

## 2022-07-25 MED ORDER — ASPIRIN 81 MG PO CHEW
81.0000 mg | CHEWABLE_TABLET | Freq: Every day | ORAL | Status: DC
  Filled 2022-07-25: qty 1

## 2022-07-25 MED ORDER — ACETAMINOPHEN 160 MG/5ML PO SOLN: 650.0000 mg | ORAL | Status: DC | PRN

## 2022-07-25 MED ORDER — ENOXAPARIN SODIUM 40 MG/0.4ML IJ SOSY
40.0000 mg | PREFILLED_SYRINGE | Freq: Every day | INTRAMUSCULAR | Status: DC
  Administered 2022-07-25 – 2022-07-27 (×3): 40 mg via SUBCUTANEOUS
  Filled 2022-07-25 (×3): qty 0.4

## 2022-07-25 NOTE — Significant Event (Signed)
Attempted to administer the missing dose of aspirin and Brilinta. Patient would not take them. Patient has become more delirious this evening as he is much more awake and has been trying to get out of bed to go home.   Dr. Joneen Roach and Dr. Pola Corn notified of the missing doses and patient's mental status. Per Dahal okay to hold aspirin and brilinta. Patient does not listen to reason and redirection, and does swing at staff with his right hand and kick at staff with his right leg. Patient's speech is incoherent and talking away by himself.

## 2022-07-25 NOTE — Evaluation (Signed)
Speech Language Pathology Evaluation Patient Details Name: Devon Davis MRN: [id number] DOB: 11/12/1921 Today's Date: 07/25/2022 Time: 1440-1450 SLP Time Calculation (min) (ACUTE ONLY): 10 min  Problem List:  Patient Active Problem List   Diagnosis Date Noted   CVA (cerebral vascular accident) (HCC) 07/25/2022   Acute CVA (cerebrovascular accident) (HCC) 07/23/2022   GERD (gastroesophageal reflux disease) 07/23/2022   Bladder stones 07/26/2020   Past Medical History:  Past Medical History:  Diagnosis Date   Arthritis    Bladder stone    GERD (gastroesophageal reflux disease)    History of kidney stones    HOH (hard of hearing)    Stroke (HCC) 2008   Past Surgical History:  Past Surgical History:  Procedure Laterality Date   BACK SURGERY     Dr Renaye Rakers   CYSTOSCOPY WITH LITHOLAPAXY N/A 07/26/2020   Procedure: CYSTOSCOPY WITH LITHOLAPAXY AND FULGERATION;  Surgeon: Bjorn Pippin, MD;  Location: [facility];  Service: Urology;  Laterality: N/A;   LITHOTRIPSY     PROSTATE BIOPSY N/A 07/26/2020   Procedure: BIOPSY TRANSRECTAL ULTRASONIC PROSTATE (TUBP);  Surgeon: Bjorn Pippin, MD;  Location: [facility];  Service: Urology;  Laterality: N/A;   HPI:  Pt is a 89 y.o. male with medical history significant for stroke, kidney stone. Pt lives at home and has a caretaker who is his long-term friend there 24/7. Pt's caregiver reports some cognitive impairment (primarily memory deficit) at baseline. Patient presented to the ED with complaints of dizziness and weakness on 5/2 and was admitted at [facility] with acute CVA. Patient discharged home on DAPT.  On arriving home patient was fine.  Caregiver left.  When he returned at 10 PM patient was dysarthric, unable to stand, facial droop, left-sided weakness.  He was returned to the ER this AM, where he subsequently failed his [employer]. Pt reported by nursing staff to be severely dysarthric. Pt had previous SLP evaluation where the pt presented with a  mild dysarthria with speech intelligibility slightly reduced (75-100% intelligible) and cognitive impairment noted with memory and thought organization may be baseline or slightly worse than baseline. No s/sx of aspiration with previous hospitalization. Speech language evaluation and BSE requested at this time.   Assessment / Plan / Recommendation Clinical Impression  Pt seen for ST evaluation for cognitive status. The pt was recently evaluated by SLP at [facility] for cog status following an admission for CVA on 5/3, where it was determined the pt had cog deficits at baseline and was near baseline at the time of eval with mild dysarthria. For the evaluation today, the pt presented as severely dysarthric (0-25% intelligibility) and required frequent translations from the caregiver to determine what was said. Pt was asked about the year, month, and his address- the pt given min-mod cues from SLP and caretaker was able to answer each of these questions. The pt reports having memory troubles at baseline and when asked if his "brain feels about the same as before these new symptoms", the pt and caretaker and friend all answered yes. Pt was able to follow one step directions and attend conversations with no prompting required, he was engaged and pleasant. Additionally, he was able to successfully joke and communicate with his caretaker t/o the eval. Given severity of dysarthria, true cognitive baseline difficult to determine. Suspected the pt is close to true baseline with some exacerbation of cognitive impairments from previous CVA and current onset of symptoms. SLP to follow up with continued assessment and education.  SLP Assessment  SLP Recommendation/Assessment: Patient needs continued Speech Lanaguage Pathology Services SLP Visit Diagnosis: Dysarthria and anarthria (R47.1);Cognitive communication deficit (R41.841);Dysphagia, unspecified (R13.10)    Recommendations for follow up therapy are one component  of a multi-disciplinary discharge planning process, led by the attending physician.  Recommendations may be updated based on patient status, additional functional criteria and insurance authorization.       Assistance Recommended at Discharge  Frequent or constant Supervision/Assistance  Functional Status Assessment Patient has had a recent decline in their functional status and demonstrates the ability to make significant improvements in function in a reasonable and predictable amount of time.  Frequency and Duration min 2x/week  1 week      SLP Evaluation Cognition  Overall Cognitive Status: History of cognitive impairments - at baseline (difficult to assess given severity of dysarthia, per caretaker and friend reports, the pt is at approx cog baseline) Arousal/Alertness: Awake/alert Orientation Level: Oriented to situation;Oriented to person;Oriented to place Year: 2024 Month: April Attention:  (suspected functionally WFL, pt able to follow directions and attend conversation but level of dysarthria make determining true baseline difficult) Focused Attention: Appears intact Focused Attention Impairment: Verbal basic Memory: Impaired (at baseline impaired) Memory Impairment: Decreased short term memory Decreased Short Term Memory: Verbal basic Awareness: Appears intact Problem Solving: Impaired Reasoning: Appears intact Safety/Judgment: Impaired (Pt attempteing to remove leads and get up occasionally and was always successfully redirected by caregiver)       Comprehension  Auditory Comprehension Overall Auditory Comprehension: Appears within functional limits for tasks assessed Yes/No Questions: Within Functional Limits Commands: Within Functional Limits Conversation: Simple Visual Recognition/Discrimination Discrimination: Not tested Reading Comprehension Reading Status: Not tested    Expression Expression Primary Mode of Expression: Verbal Verbal Expression Overall  Verbal Expression: Appears within functional limits for tasks assessed Written Expression Dominant Hand: Right   Oral / Motor  Oral Motor/Sensory Function Overall Oral Motor/Sensory Function: Moderate impairment Facial ROM: Reduced left Facial Symmetry: Abnormal symmetry left Facial Strength: Reduced left Lingual ROM: Within Functional Limits Lingual Symmetry: Within Functional Limits Lingual Strength: Within Functional Limits Lingual Sensation: Within Functional Limits Motor Speech Overall Motor Speech: Impaired Articulation: Impaired Level of Impairment: Word Intelligibility: Intelligibility reduced Word: 25-49% accurate Phrase: 0-24% accurate Sentence: 0-24% accurate Conversation: 0-24% accurate           Dione Housekeeper M.S. CF-SLP

## 2022-07-25 NOTE — Code Documentation (Signed)
Responded to Code Stroke called at 2337 for L sided weakness and slurred speech, [id number]. Pt arrived at 2352, CBG-187, NIH-8, CT head-negative for acute changes. TNK not given-recent stroke. Plan VS/neuro checks q2h X 12, then q4h, MRI.

## 2022-07-25 NOTE — ED Triage Notes (Cosign Needed)
The pt was just discharged from [facility] this afternoon  his caregiver reports that the pt was  lsn at 1800  and when he came back to the house at 300 the pts speech was different and he had some lt sided weakness

## 2022-07-25 NOTE — Progress Notes (Signed)
PROGRESS NOTE  Devon Davis  DOB: 1922/02/15  PCP: Ignatius Specking, MD [id number]  DOA: 08/10/2022  LOS: 0 days  Hospital Day: 2  Brief narrative: Devon Davis is a 89 y.o. male who lives at home alone, doesn't have round-the-clock caregivers, PMH significant for recent right lacunar and left subacute inferior occipital stroke, prior strokes without significant residual deficit; history of DM2, HTN, HLD currently on DAPT, metastatic prostate cancer, f/u Dr. Annabell Howells. He was just admitted at [facility] 5/2-5/3, where he had presented with left-sided weakness and slurred speech.  Found to have right lateral and left subacute inferior occipital stroke.  He was switched from previous regimen of aspirin and Plavix to aspirin and Brilinta and discharged to home.  At home, caregiver noted dysarthria, ambulatory stent, facial droop, left-sided weakness and hence he was brought to the ED last night.  Code stroke was activated CT head did not show any acute intracranial process  CT angio head and neck showed 1. Severe, near occlusive stenosis in the proximal and mid left P2, with poor flow in the left P3 segments. 2. Severe stenosis in the distal left cavernous and proximal left supraclinoid ICA. 3. Severe stenosis in the proximal right M1 and moderate stenosis in the distal right M1. Multifocal mild stenosis in the right MCA branches. 4. Mild stenosis in the proximal right P1 and proximal right P2. 5. No hemodynamically significant stenosis in the neck. 6. Aortic atherosclerosis.  MRI brain showed mild extension, enlargement of the right coronary due to/lentiform infarct compared to MRI from 2 days prior.  No history of hemorrhage or mass effect or new areas of ischemia.  Admitted to [facility] Seen by neurology  Subjective: Patient was seen and examined this morning.  Elderly Caucasian male.  Dysarthric.  Unable to engage in a conversation.  Also he does not have his hearing aid at bedside.   Per RN, he was agitated earlier.  No caregiver at bedside.  I called one of his friends listed [name] who is a retired Careers adviser and lives at [place].   Patient does not have any family member.  The 2 contacts listed in the chart are both his friends and try their best to check on him regularly.  Patient lives at home alone.  He has part-time caregivers per night but nobody during the day.  For the last several weeks, he has impaired mobility and has not been able to maintain the activities of daily life but he has been resistant to going to an assisted living facility.  Patient is a DNR/DNI.   Assessment and plan: Acute progression of CVA  Presented with recurrent stroke symptoms Due to severe intracranial atherosclerotic disease CTA, CT angio head and neck and MRI brain as above. Recently completed a stroke workup pending echocardiogram Neurology consult appreciated. Per neurologic recommendation, patient has been started on Aspirin 81 mg daily and Brilinta 90 mg twice daily for 1 month  followed by aspirin 81 mg daily and Plavix 75 mg daily for 2 months.  Also to continue on atorvastatin 40 mg daily. Permissive hypertension for first 24 to 48 hours  At the time of my evaluation this morning, patient has persistent dysarthria, restlessness. PT/OT/ST eval pending.  GERD PPI  Long-term care Patient so far was living at home alone and has been resistant to idea of assisted living facility. His listed contact/friend requested to see if patient qualifies for SNF.  If patient's condition worsens in next  24-48 hrs, hospice would be an appropriate option.  [name] offers being available at any time of the day/night for call if needed.    Mobility: Pending PT OT eval  Goals of care   Code Status: DNR     DVT prophylaxis:  enoxaparin (LOVENOX) injection 40 mg Start: 07/25/22 1000   Antimicrobials: None Fluid: None Consultants: Neurology Family Communication:  Discussed with listed contact.  Status: Inpatient Level of care:  Telemetry Medical   Patient from: Home Anticipated d/c to: Pending clinical course Needs to continue in-hospital care:  Pending PT/OT/ST eval    Diet:  Diet Order             Diet NPO time specified  Diet effective now                   Scheduled Meds:  [START ON 07/26/2022]  stroke: early stages of recovery book   Does not apply Once   aspirin  81 mg Oral Daily   enoxaparin (LOVENOX) injection  40 mg Subcutaneous Daily   ticagrelor  90 mg Oral BID    PRN meds: acetaminophen **OR** acetaminophen (TYLENOL) oral liquid 160 mg/5 mL **OR** acetaminophen, senna-docusate   Infusions:    Antimicrobials: Anti-infectives (From admission, onward)    None       Nutritional status:  Body mass index is 28.62 kg/m.          Objective: Vitals:   07/25/22 0906 07/25/22 0910  BP:    Pulse:  100  Resp:  (!) 22  Temp: (!) 97.4 F (36.3 C)   SpO2:  97%    Intake/Output Summary (Last 24 hours) at 07/25/2022 1000 Last data filed at 07/25/2022 0502 Gross per 24 hour  Intake 500 ml  Output --  Net 500 ml   Filed Weights   07/25/22 0127  Weight: 87.9 kg   Weight change:  Body mass index is 28.62 kg/m.   Physical Exam: General exam: Pleasant, elderly Caucasian male.  Not in physical distress Skin: No rashes, lesions or ulcers. HEENT: Atraumatic, normocephalic, no obvious bleeding Lungs: Clear to auscultation bilaterally CVS: Regular rate and rhythm, no murmur GI/Abd soft, nontender, nondistended, bowel sound present CNS: Awake, mumbles.  Clearly dysarthric, unable to have a meaningful conversation. Psychiatry: Anxious look Extremities: No pedal edema, no calf tenderness  Data Review: I have personally reviewed the laboratory data and studies available.  F/u labs ordered Unresulted Labs (From admission, onward)     Start     Ordered   08/01/22 0500  Creatinine, serum  (enoxaparin  (LOVENOX)    CrCl >/= 30 ml/min)  Weekly,   R     Comments: while on enoxaparin therapy    07/25/22 0403   07/25/22 0500  Lipid panel  (Labs)  Tomorrow morning,   R       Comments: Fasting    07/25/22 0403   07/25/22 0020  Blood gas, venous (at [facility] and [facility])  Once,   R        07/25/22 0019            Total time spent in review of labs and imaging, patient evaluation, formulation of plan, documentation and communication with family: 65 minutes  Signed, Lorin Glass, MD [facility] 07/25/2022

## 2022-07-25 NOTE — Plan of Care (Signed)
  Problem: Ischemic Stroke/TIA Tissue Perfusion: Goal: Complications of ischemic stroke/TIA will be minimized Outcome: Progressing   Problem: Coping: Goal: Will verbalize positive feelings about self Outcome: Progressing   Problem: Health Behavior/Discharge Planning: Goal: Ability to manage health-related needs will improve Outcome: Progressing   Problem: Self-Care: Goal: Ability to participate in self-care as condition permits will improve Outcome: Progressing   Problem: Education: Goal: Knowledge of disease or condition will improve Outcome: Progressing   Problem: Education: Goal: Knowledge of secondary prevention will improve (MUST DOCUMENT ALL) Outcome: Progressing   Problem: Education: Goal: Knowledge of patient specific risk factors will improve Loraine Leriche N/A or DELETE if not current risk factor) Outcome: Progressing

## 2022-07-25 NOTE — ED Notes (Signed)
ED TO INPATIENT HANDOFF REPORT  ED Nurse Name and Phone # Morrie Sheldon [phone]  S Name/Age/Gender Devon Davis 89 y.o. male Room/Bed: 002C/002C  Code Status   Code Status: DNR  Home/SNF/Other Home Patient oriented to: self and place Is this baseline? No   Triage Complete: Triage complete  Chief Complaint CVA (cerebral vascular accident) [facility]) [I63.9]  Triage Note The pt was just discharged from [facility] this afternoon  his caregiver reports that the pt was  lsn at 1800  and when he came back to the house at 300 the pts speech was different and he had some lt sided weakness   Allergies No Known Allergies  Level of Care/Admitting Diagnosis ED Disposition     ED Disposition  Admit   Condition  --   Comment  Hospital Area: [facility] [[id number]]  Level of Care: Telemetry Medical [104]  May admit patient to [facility] or [facility] if equivalent level of care is available:: No  Covid Evaluation: Confirmed COVID Negative  Diagnosis: CVA (cerebral vascular accident) [facility]) [[id number]]  Admitting Physician: Gery Pray [[id number]]  Attending Physician: Gery Pray [[id number]]  Certification:: I certify this patient will need inpatient services for at least 2 midnights  Estimated Length of Stay: 2          B Medical/Surgery History Past Medical History:  Diagnosis Date   Arthritis    Bladder stone    GERD (gastroesophageal reflux disease)    History of kidney stones    HOH (hard of hearing)    Stroke [facility]) 2008   Past Surgical History:  Procedure Laterality Date   BACK SURGERY     Dr Renaye Rakers   CYSTOSCOPY WITH LITHOLAPAXY N/A 07/26/2020   Procedure: CYSTOSCOPY WITH LITHOLAPAXY AND FULGERATION;  Surgeon: Bjorn Pippin, MD;  Location: [facility]S;  Service: Urology;  Laterality: N/A;   LITHOTRIPSY     PROSTATE BIOPSY N/A 07/26/2020   Procedure: BIOPSY TRANSRECTAL ULTRASONIC PROSTATE (TUBP);  Surgeon: Bjorn Pippin, MD;  Location: [facility];  Service: Urology;   Laterality: N/A;     A IV Location/Drains/Wounds Patient Lines/Drains/Airways Status     Active Line/Drains/Airways     Name Placement date Placement time Site Days   Peripheral IV 07/25/22 20 G Left Antecubital 07/25/22  0255  Antecubital  less than 1            Intake/Output Last 24 hours  Intake/Output Summary (Last 24 hours) at 07/25/2022 1349 Last data filed at 07/25/2022 0502 Gross per 24 hour  Intake 500 ml  Output --  Net 500 ml    Labs/Imaging Results for orders placed or performed during the hospital encounter of 08-02-2022 (from the past 48 hour(s))  CBG monitoring, ED     Status: Abnormal   Collection Time: Aug 02, 2022 11:53 PM  Result Value Ref Range   Glucose-Capillary 187 (H) 70 - 99 mg/dL    Comment: Glucose reference range applies only to samples taken after fasting for at least 8 hours.  Ethanol     Status: None   Collection Time: Aug 02, 2022 11:55 PM  Result Value Ref Range   Alcohol, Ethyl (B) <10 <10 mg/dL    Comment: (NOTE) Lowest detectable limit for serum alcohol is 10 mg/dL.  For medical purposes only. Performed at [facility], [address] [place], [place] [address]   Protime-INR     Status: None   Collection Time: 2022-08-02 11:55 PM  Result Value Ref Range   Prothrombin  Time 13.4 11.4 - 15.2 seconds   INR 1.0 0.8 - 1.2    Comment: (NOTE) INR goal varies based on device and disease states. Performed at [facility], [address], [place], [place] [address]   APTT     Status: None   Collection Time: 13-Aug-2022 11:55 PM  Result Value Ref Range   aPTT 27 24 - 36 seconds    Comment: Performed at [facility], [address] [place], [place] [address]  CBC     Status: Abnormal   Collection Time: Aug 13, 2022 11:55 PM  Result Value Ref Range   WBC 7.1 4.0 - 10.5 K/uL   RBC 4.21 (L) 4.22 - 5.81 MIL/uL   Hemoglobin 14.2 13.0 - 17.0 g/dL   HCT 09.8 11.9 - 14.7 %   MCV 101.7 (H) 80.0 - 100.0 fL   MCH 33.7 26.0 - 34.0 pg    MCHC 33.2 30.0 - 36.0 g/dL   RDW 82.9 56.2 - 13.0 %   Platelets 189 150 - 400 K/uL   nRBC 0.0 0.0 - 0.2 %    Comment: Performed at [facility], [address] [place], [place] [address]  Differential     Status: None   Collection Time: 08/13/2022 11:55 PM  Result Value Ref Range   Neutrophils Relative % 72 %   Neutro Abs 5.1 1.7 - 7.7 K/uL   Lymphocytes Relative 13 %   Lymphs Abs 1.0 0.7 - 4.0 K/uL   Monocytes Relative 12 %   Monocytes Absolute 0.8 0.1 - 1.0 K/uL   Eosinophils Relative 2 %   Eosinophils Absolute 0.2 0.0 - 0.5 K/uL   Basophils Relative 1 %   Basophils Absolute 0.1 0.0 - 0.1 K/uL   Immature Granulocytes 0 %   Abs Immature Granulocytes 0.02 0.00 - 0.07 K/uL    Comment: Performed at [facility], [address] [place], [place] [address]  Comprehensive metabolic panel     Status: Abnormal   Collection Time: 08/13/22 11:55 PM  Result Value Ref Range   Sodium 136 135 - 145 mmol/L   Potassium 3.6 3.5 - 5.1 mmol/L   Chloride 103 98 - 111 mmol/L   CO2 22 22 - 32 mmol/L   Glucose, Bld 183 (H) 70 - 99 mg/dL    Comment: Glucose reference range applies only to samples taken after fasting for at least 8 hours.   BUN 12 8 - 23 mg/dL   Creatinine, Ser 9.52 0.61 - 1.24 mg/dL   Calcium 9.3 8.9 - 84.1 mg/dL   Total Protein 6.4 (L) 6.5 - 8.1 g/dL   Albumin 3.5 3.5 - 5.0 g/dL   AST 18 15 - 41 U/L   ALT 14 0 - 44 U/L   Alkaline Phosphatase 70 38 - 126 U/L   Total Bilirubin 0.7 0.3 - 1.2 mg/dL   GFR, Estimated >32 >44 mL/min    Comment: (NOTE) Calculated using the CKD-EPI Creatinine Equation (2021)    Anion gap 11 5 - 15    Comment: Performed at [facility], [address], [place], [place] [address]  Brain natriuretic peptide     Status: Abnormal   Collection Time: 2022-08-13 11:55 PM  Result Value Ref Range   B Natriuretic Peptide 254.1 (H) 0.0 - 100.0 pg/mL    Comment: Performed at [facility], [address], [place], [place] [address]   Troponin I (High Sensitivity)  Status: Abnormal   Collection Time: 07/27/2022 11:55 PM  Result Value Ref Range   Troponin I (High Sensitivity) 23 (H) <18 ng/L    Comment: (NOTE) Elevated high sensitivity troponin I (hsTnI) values and significant  changes across serial measurements may suggest ACS but many other  chronic and acute conditions are known to elevate hsTnI results.  Refer to the "Links" section for chest pain algorithms and additional  guidance. Performed at [facility], [address] [place], [place] [address]   I-stat chem 8, ED     Status: Abnormal   Collection Time: 07/25/22 12:00 AM  Result Value Ref Range   Sodium 137 135 - 145 mmol/L   Potassium 3.6 3.5 - 5.1 mmol/L   Chloride 105 98 - 111 mmol/L   BUN 14 8 - 23 mg/dL   Creatinine, Ser 1.91 0.61 - 1.24 mg/dL   Glucose, Bld 478 (H) 70 - 99 mg/dL    Comment: Glucose reference range applies only to samples taken after fasting for at least 8 hours.   Calcium, Ion 1.15 1.15 - 1.40 mmol/L   TCO2 22 22 - 32 mmol/L   Hemoglobin 14.3 13.0 - 17.0 g/dL   HCT 29.5 62.1 - 30.8 %  I-Stat venous blood gas, ED     Status: Abnormal   Collection Time: 07/25/22 12:23 AM  Result Value Ref Range   pH, Ven 7.486 (H) 7.25 - 7.43   pCO2, Ven 30.1 (L) 44 - 60 mmHg   pO2, Ven 106 (H) 32 - 45 mmHg   Bicarbonate 22.8 20.0 - 28.0 mmol/L   TCO2 24 22 - 32 mmol/L   O2 Saturation 99 %   Acid-Base Excess 0.0 0.0 - 2.0 mmol/L   Sodium 137 135 - 145 mmol/L   Potassium 3.7 3.5 - 5.1 mmol/L   Calcium, Ion 1.15 1.15 - 1.40 mmol/L   HCT 42.0 39.0 - 52.0 %   Hemoglobin 14.3 13.0 - 17.0 g/dL   Sample type VENOUS   Lipid panel     Status: None   Collection Time: 07/25/22  3:07 AM  Result Value Ref Range   Cholesterol 159 0 - 200 mg/dL   Triglycerides 657 <846 mg/dL   HDL 41 >96 mg/dL   Total CHOL/HDL Ratio 3.9 RATIO   VLDL 24 0 - 40 mg/dL   LDL Cholesterol 94 0 - 99 mg/dL    Comment:        Total Cholesterol/HDL:CHD  Risk Coronary Heart Disease Risk Table                     Men   Women  1/2 Average Risk   3.4   3.3  Average Risk       5.0   4.4  2 X Average Risk   9.6   7.1  3 X Average Risk  23.4   11.0        Use the calculated Patient Ratio above and the CHD Risk Table to determine the patient's CHD Risk.        ATP III CLASSIFICATION (LDL):  <100     mg/dL   Optimal  295-284  mg/dL   Near or Above                    Optimal  130-159  mg/dL   Borderline  132-440  mg/dL   High  >102     mg/dL   Very High Performed at  [facility], [address] [place], [place] [address]   Troponin I (High Sensitivity)     Status: Abnormal   Collection Time: 07/25/22  3:07 AM  Result Value Ref Range   Troponin I (High Sensitivity) 24 (H) <18 ng/L    Comment: (NOTE) Elevated high sensitivity troponin I (hsTnI) values and significant  changes across serial measurements may suggest ACS but many other  chronic and acute conditions are known to elevate hsTnI results.  Refer to the "Links" section for chest pain algorithms and additional  guidance. Performed at [facility], [address], [place], [place] [address]   Comprehensive metabolic panel     Status: Abnormal   Collection Time: 07/25/22  3:07 AM  Result Value Ref Range   Sodium 136 135 - 145 mmol/L   Potassium 3.9 3.5 - 5.1 mmol/L   Chloride 107 98 - 111 mmol/L   CO2 24 22 - 32 mmol/L   Glucose, Bld 146 (H) 70 - 99 mg/dL    Comment: Glucose reference range applies only to samples taken after fasting for at least 8 hours.   BUN 13 8 - 23 mg/dL   Creatinine, Ser 0.98 0.61 - 1.24 mg/dL   Calcium 8.8 (L) 8.9 - 10.3 mg/dL   Total Protein 5.3 (L) 6.5 - 8.1 g/dL   Albumin 3.2 (L) 3.5 - 5.0 g/dL   AST 17 15 - 41 U/L   ALT 14 0 - 44 U/L   Alkaline Phosphatase 68 38 - 126 U/L   Total Bilirubin 0.9 0.3 - 1.2 mg/dL   GFR, Estimated >11 >91 mL/min    Comment: (NOTE) Calculated using the CKD-EPI Creatinine Equation (2021)    Anion  gap 5 5 - 15    Comment: Performed at [facility], [address] [place], [place] [address]  Urine rapid drug screen (hosp performed)     Status: None   Collection Time: 07/25/22  5:18 AM  Result Value Ref Range   Opiates NONE DETECTED NONE DETECTED   Cocaine NONE DETECTED NONE DETECTED   Benzodiazepines NONE DETECTED NONE DETECTED   Amphetamines NONE DETECTED NONE DETECTED   Tetrahydrocannabinol NONE DETECTED NONE DETECTED   Barbiturates NONE DETECTED NONE DETECTED    Comment: (NOTE) DRUG SCREEN FOR MEDICAL PURPOSES ONLY.  IF CONFIRMATION IS NEEDED FOR ANY PURPOSE, NOTIFY LAB WITHIN 5 DAYS.  LOWEST DETECTABLE LIMITS FOR URINE DRUG SCREEN Drug Class                     Cutoff (ng/mL) Amphetamine and metabolites    1000 Barbiturate and metabolites    200 Benzodiazepine                 200 Opiates and metabolites        300 Cocaine and metabolites        300 THC                            50 Performed at [facility], [address] [place], [place] [address]   Urinalysis, Routine w reflex microscopic -Urine, Clean Catch     Status: Abnormal   Collection Time: 07/25/22  5:18 AM  Result Value Ref Range   Color, Urine YELLOW YELLOW   APPearance CLEAR CLEAR   Specific Gravity, Urine 1.039 (H) 1.005 - 1.030   pH 5.0 5.0 - 8.0   Glucose, UA  NEGATIVE NEGATIVE mg/dL   Hgb urine dipstick NEGATIVE NEGATIVE   Bilirubin Urine NEGATIVE NEGATIVE   Ketones, ur NEGATIVE NEGATIVE mg/dL   Protein, ur NEGATIVE NEGATIVE mg/dL   Nitrite NEGATIVE NEGATIVE   Leukocytes,Ua NEGATIVE NEGATIVE    Comment: Performed at [facility], [address] [place], [place] [address]   MR BRAIN WO CONTRAST  Result Date: 07/25/2022 CLINICAL DATA:  89 year old male left side weakness, speech difficulty. Small recent right corona radiata/lentiform ischemia on MRI 2 days ago. Near occluded left PCA, severe distal left ICA and right MCA stenoses by CTA today. EXAM: MRI HEAD WITHOUT CONTRAST  TECHNIQUE: Multiplanar, multiecho pulse sequences of the brain and surrounding structures were obtained without intravenous contrast. COMPARISON:  CTA head and neck 0337 hours today. Recent brain MRI 07/23/2022. FINDINGS: Brain: Coronal T2 and axial T1 weighted imaging today is motion degraded despite repeated imaging attempts. Mild progression of patchy, linear diffusion restriction tracking from the posterior right corona radiata into the right lentiform (series 5, image 79 today versus series 5, image 19 on 07/23/2022. But no convincing new areas of restricted diffusion. Stable developing encephalomalacia in the posterior left temporal and occipital lobes compatible with nonacute ischemia (faint abnormal trace diffusion and primarily facilitated on ADC). Mild if any hemosiderin there. And stable chronic small vessel disease elsewhere including bilateral cerebral white matter, bilateral deep gray matter nuclei chronic lacune. Brainstem and cerebellum relatively spared. No other convincing cortical encephalomalacia or chronic cerebral blood products. No midline shift, mass effect, evidence of mass lesion, ventriculomegaly, extra-axial collection or acute intracranial hemorrhage. Cervicomedullary junction and pituitary are within normal limits. Vascular: Major intracranial vascular flow voids are stable. Skull and upper cervical spine: Negative for age visible cervical spine. Visualized bone marrow signal is within normal limits. Sinuses/Orbits: Stable, negative. Other: None. IMPRESSION: 1. Mild extension, enlargement of the right corona radiata/lentiform infarct since MRI two days ago. No associated hemorrhage or mass effect. No new areas of ischemia. 2. Stable ischemic changes elsewhere. Electronically Signed   By: Odessa Fleming M.D.   On: 07/25/2022 04:27   CT ANGIO HEAD NECK W WO CM  Result Date: 07/25/2022 CLINICAL DATA:  Left-sided weakness, speech difficulties EXAM: CT ANGIOGRAPHY HEAD AND NECK WITH AND WITHOUT  CONTRAST TECHNIQUE: Multidetector CT imaging of the head and neck was performed using the standard protocol during bolus administration of intravenous contrast. Multiplanar CT image reconstructions and MIPs were obtained to evaluate the vascular anatomy. Carotid stenosis measurements (when applicable) are obtained utilizing NASCET criteria, using the distal internal carotid diameter as the denominator. RADIATION DOSE REDUCTION: This exam was performed according to the departmental dose-optimization program which includes automated exposure control, adjustment of the mA and/or kV according to patient size and/or use of iterative reconstruction technique. CONTRAST:  75mL OMNIPAQUE IOHEXOL 350 MG/ML SOLN COMPARISON:  No prior CTA available, correlation is made with CT head 07/25/2022 FINDINGS: CT HEAD FINDINGS For noncontrast findings, please see 07/25/2022 CT head. CTA NECK FINDINGS Aortic arch: Standard branching. Imaged portion shows no evidence of aneurysm or dissection. No significant stenosis of the major arch vessel origins. Aortic atherosclerosis. Right carotid system: No evidence of dissection, occlusion, or hemodynamically significant stenosis (greater than 50%). Left carotid system: No evidence of dissection, occlusion, or hemodynamically significant stenosis (greater than 50%). Vertebral arteries: No evidence of dissection, occlusion, or hemodynamically significant stenosis (greater than 50%). Skeleton: No acute osseous abnormality. Degenerative changes in the cervical spine. Other neck: Hypoenhancing nodule in the right thyroid lobe  measures up to 9 mm, for which no follow-up is currently indicated. (Reference: J Am Coll Radiol. 2015 Feb;12(2): 143-50) Upper chest: No focal pulmonary opacity or pleural effusion. Review of the MIP images confirms the above findings CTA HEAD FINDINGS Anterior circulation: Both internal carotid arteries are patent to the termini, with moderate stenosis in the right cavernous  segment and severe stenosis in distal left cavernous and proximal left supraclinoid ICA. Patent right A1. With severely hypoplastic left A1. Multifocal moderate stenosis in the right A2 and A3 (series 7, images 83-84 and 94). No significant stenosis in the left ACA. Severe stenosis in the proximal right M1 and moderate stenosis in the distal right M1 (series 7, image 109). No significant stenosis in the left M1. Multifocal mild stenosis in the right MCA branches (series 7, image 115, for example). No significant stenosis in the left MCA branches. Stenosis. Posterior circulation: Vertebral arteries patent to the vertebrobasilar junction without significant stenosis. Posterior inferior cerebellar arteries patent proximally. Basilar patent to its distal aspect without significant stenosis. Superior cerebellar arteries patent proximally. Patent P1 segments. Severe stenosis in the proximal left P2 (series 7, image 115), mid P2 (series 7, image 120), and slightly more distal P2 (series 7, image 122), with poor flow in the left P3 segments. Mild stenosis in the proximal right P 2 (series 7, image 121) and mid right P1 (series 7, image 124). No other significant stenosis in the right PCA. The left posterior communicating artery is diminutive but patent. Venous sinuses: As permitted by contrast timing, patent. Anatomic variants: None significant. Review of the MIP images confirms the above findings IMPRESSION: 1. Severe, near occlusive stenosis in the proximal and mid left P2, with poor flow in the left P3 segments. 2. Severe stenosis in the distal left cavernous and proximal left supraclinoid ICA. 3. Severe stenosis in the proximal right M1 and moderate stenosis in the distal right M1. Multifocal mild stenosis in the right MCA branches. 4. Mild stenosis in the proximal right P1 and proximal right P2. 5. No hemodynamically significant stenosis in the neck. 6. Aortic atherosclerosis. Aortic Atherosclerosis (ICD10-I70.0).  Imaging results were communicated on 07/25/2022 at 4:07 am to provider BHAGAT via secure text paging. Electronically Signed   By: Wiliam Ke M.D.   On: 07/25/2022 04:08   DG Chest Portable 1 View  Result Date: 07/25/2022 CLINICAL DATA:  Difficulty breathing EXAM: PORTABLE CHEST 1 VIEW COMPARISON:  01/30/2017 FINDINGS: Cardiomegaly. Mediastinal contours within normal limits. Aortic atherosclerosis. No confluent airspace opacities, effusions or edema. IMPRESSION: Mild cardiomegaly.  No active disease. Electronically Signed   By: Charlett Nose M.D.   On: 07/25/2022 01:10   CT HEAD CODE STROKE WO CONTRAST  Result Date: 07/25/2022 CLINICAL DATA:  Code stroke. EXAM: CT HEAD WITHOUT CONTRAST TECHNIQUE: Contiguous axial images were obtained from the base of the skull through the vertex without intravenous contrast. RADIATION DOSE REDUCTION: This exam was performed according to the departmental dose-optimization program which includes automated exposure control, adjustment of the mA and/or kV according to patient size and/or use of iterative reconstruction technique. COMPARISON:  No prior CT head available, correlation is made with MRI head 07/23/2022 FINDINGS: Brain: No evidence of acute infarction, hemorrhage, mass, mass effect, or midline shift. No hydrocephalus or extra-axial collection. Periventricular white matter changes, likely the sequela of chronic small vessel ischemic disease. Vascular: No hyperdense vessel. Atherosclerotic calcifications in the intracranial carotid and vertebral arteries. Skull: Negative for fracture or focal lesion. Sinuses/Orbits: No acute finding. Other:  The mastoid air cells are well aerated. ASPECTS [facility] Stroke Program Early CT Score) - Ganglionic level infarction (caudate, lentiform nuclei, internal capsule, insula, M1-M3 cortex): 7 - Supraganglionic infarction (M4-M6 cortex): 3 Total score (0-10 with 10 being normal): 10 IMPRESSION: 1. No acute intracranial process. 2. ASPECTS  is 10. Imaging results were communicated on 07/25/2022 at 12:05 am to provider BHAGAT via secure text paging. Electronically Signed   By: Wiliam Ke M.D.   On: 07/25/2022 00:05   ECHOCARDIOGRAM COMPLETE  Result Date: 08/12/2022    ECHOCARDIOGRAM REPORT   Patient Name:   ARDA CESAR Date of Exam: 08/19/2022 Medical Rec #:  [id number]          Height:       69.0 in Accession #:    [id number]         Weight:       193.8 lb Date of Birth:  09-Jun-1921         BSA:          2.038 m Patient Age:    89 years           BP:           169/75 mmHg Patient Gender: M                  HR:           79 bpm. [facility] Procedure: 2D Echo, Cardiac Doppler, Color Doppler and Intracardiac            Opacification Agent Indications:   Stroke  History:       Patient has no prior history of Echocardiogram examinations.                Stroke.  Sonographer:   Milda Smart Referring      [id number] Onnie Boer Phys:  Sonographer Comments: Technically difficult study due to poor echo windows. Image acquisition challenging due to patient body habitus and Image acquisition challenging due to respiratory motion. IMPRESSIONS  1. Left ventricular ejection fraction, by estimation, is 55 to 60%. The left ventricle has normal function. The left ventricle has no regional wall motion abnormalities. Left ventricular diastolic parameters are consistent with Grade I diastolic dysfunction (impaired relaxation).  2. Right ventricular systolic function was not well visualized. The right ventricular size is normal. There is normal pulmonary artery systolic pressure.  3. The mitral valve is normal in structure. No evidence of mitral valve regurgitation. No evidence of mitral stenosis.  4. The aortic valve was not well visualized. There is mild calcification of the aortic valve. Aortic valve regurgitation is mild to moderate. Mild aortic valve stenosis. Aortic valve mean gradient measures 12.0 mmHg. Aortic valve Vmax measures 2.24  m/s.  5. The inferior vena cava is normal in size with greater than 50% respiratory variability, suggesting right atrial pressure of 3 mmHg. Comparison(s): No prior Echocardiogram. FINDINGS  Left Ventricle: Left ventricular ejection fraction, by estimation, is 55 to 60%. The left ventricle has normal function. The left ventricle has no regional wall motion abnormalities. Definity contrast agent was given IV to delineate the left ventricular  endocardial borders. The left ventricular internal cavity size was normal in size. There is no left ventricular hypertrophy. Left ventricular diastolic parameters are consistent with Grade I diastolic dysfunction (impaired relaxation). Right Ventricle: The right ventricular size is normal. No increase in right ventricular wall thickness. Right ventricular systolic function was not well visualized. There is normal  pulmonary artery systolic pressure. The tricuspid regurgitant velocity is  1.99 m/s, and with an assumed right atrial pressure of 3 mmHg, the estimated right ventricular systolic pressure is 18.8 mmHg. Left Atrium: Left atrial size was normal in size. Right Atrium: Right atrial size was normal in size. Pericardium: There is no evidence of pericardial effusion. Mitral Valve: The mitral valve is normal in structure. No evidence of mitral valve regurgitation. No evidence of mitral valve stenosis. Tricuspid Valve: The tricuspid valve is normal in structure. Tricuspid valve regurgitation is trivial. No evidence of tricuspid stenosis. Aortic Valve: The aortic valve was not well visualized. There is mild calcification of the aortic valve. Aortic valve regurgitation is mild to moderate. Mild aortic stenosis is present. Aortic valve mean gradient measures 12.0 mmHg. Aortic valve peak gradient measures 20.1 mmHg. Aortic valve area, by VTI measures 1.27 cm. Pulmonic Valve: The pulmonic valve was not well visualized. Pulmonic valve regurgitation is not visualized. No evidence of  pulmonic stenosis. Aorta: The aortic root and ascending aorta are structurally normal, with no evidence of dilitation. Venous: The inferior vena cava is normal in size with greater than 50% respiratory variability, suggesting right atrial pressure of 3 mmHg. IAS/Shunts: The interatrial septum was not well visualized.  LEFT VENTRICLE PLAX 2D LVIDd:         5.30 cm   Diastology LVIDs:         3.80 cm   LV e' medial:    2.82 cm/s LV PW:         1.10 cm   LV E/e' medial:  12.3 LV IVS:        0.90 cm   LV e' lateral:   3.57 cm/s LVOT diam:     2.00 cm   LV E/e' lateral: 9.7 LV SV:         52 LV SV Index:   25 LVOT Area:     3.14 cm  RIGHT VENTRICLE RV S prime:     8.52 cm/s TAPSE (M-mode): 2.3 cm LEFT ATRIUM             Index        RIGHT ATRIUM           Index LA diam:        4.20 cm 2.06 cm/m   RA Area:     12.80 cm LA Vol (A2C):   53.4 ml 26.20 ml/m  RA Volume:   27.20 ml  13.34 ml/m LA Vol (A4C):   47.5 ml 23.30 ml/m LA Biplane Vol: 50.5 ml 24.77 ml/m  AORTIC VALVE AV Area (Vmax):    1.25 cm AV Area (Vmean):   1.19 cm AV Area (VTI):     1.27 cm AV Vmax:           224.00 cm/s AV Vmean:          167.000 cm/s AV VTI:            0.409 m AV Peak Grad:      20.1 mmHg AV Mean Grad:      12.0 mmHg LVOT Vmax:         88.80 cm/s LVOT Vmean:        63.100 cm/s LVOT VTI:          0.165 m LVOT/AV VTI ratio: 0.40  AORTA Ao Root diam: 3.40 cm Ao Asc diam:  3.40 cm MITRAL VALVE  TRICUSPID VALVE MV Area (PHT): 3.13 cm     TR Peak grad:   15.8 mmHg MV Decel Time: 242 msec     TR Vmax:        199.00 cm/s MV E velocity: 34.70 cm/s MV A velocity: 103.00 cm/s  SHUNTS MV E/A ratio:  0.34         Systemic VTI:  0.16 m                             Systemic Diam: 2.00 cm Vishnu Priya Mallipeddi Electronically signed by Winfield Rast Mallipeddi Signature Date/Time: 07/27/2022/2:54:40 PM    Final    US Carotid Bilateral (at [facility] and [facility] only)  Result Date: 07/31/2022 CLINICAL DATA:  CVA.  Transient left lower extremity  weakness. EXAM: BILATERAL CAROTID DUPLEX ULTRASOUND TECHNIQUE: Wallace Cullens scale imaging, color Doppler and duplex ultrasound were performed of bilateral carotid and vertebral arteries in the neck. COMPARISON:  None Available. FINDINGS: Criteria: Quantification of carotid stenosis is based on velocity parameters that correlate the residual internal carotid diameter with NASCET-based stenosis levels, using the diameter of the distal internal carotid lumen as the denominator for stenosis measurement. The following velocity measurements were obtained: RIGHT ICA: 68/22 cm/sec CCA: 68/7 cm/sec SYSTOLIC ICA/CCA RATIO:  1.0 ECA: 102 cm/sec LEFT ICA: 40/7 cm/sec CCA: 74/7 cm/sec SYSTOLIC ICA/CCA RATIO:  0.5 ECA: 132 cm/sec RIGHT CAROTID ARTERY: There is no grayscale evidence of significant intimal thickening or atherosclerotic plaque affecting the interrogated portions of the right carotid system. There are no elevated peak systolic velocities within the interrogated course of the right internal carotid artery to suggest a hemodynamically significant stenosis. RIGHT VERTEBRAL ARTERY:  Antegrade flow LEFT CAROTID ARTERY: There is a minimal amount of eccentric echogenic plaque within left carotid bulb (images 48 and 50), extending to involve the origin and proximal aspects of the left internal carotid artery (image 58), not resulting in elevated peak systolic velocities within the interrogated course of the left internal carotid artery to suggest a hemodynamically significant stenosis. LEFT VERTEBRAL ARTERY:  Antegrade flow IMPRESSION: 1. Minimal amount of left-sided atherosclerotic plaque, not resulting in a hemodynamically significant stenosis. 2. Unremarkable sonographic evaluation of the right carotid system. Electronically Signed   By: Simonne Come M.D.   On: 08/10/2022 14:01   MR BRAIN WO CONTRAST  Result Date: 07/23/2022 CLINICAL DATA:  TIA EXAM: MRI HEAD WITHOUT CONTRAST MRA HEAD WITHOUT CONTRAST TECHNIQUE: Multiplanar,  multi-echo pulse sequences of the brain and surrounding structures were acquired without intravenous contrast. Angiographic images of the Circle of Willis were acquired using MRA technique without intravenous contrast. COMPARISON:  None Available. FINDINGS: MRI HEAD FINDINGS Brain: There is a small focus of diffusion restriction in the right lentiform nucleus with associated FLAIR signal abnormality consistent with acute to early subacute infarct. There is no hemorrhage or mass effect. There is additional curvilinear diffusion restriction with FLAIR signal abnormality along the undersurface of the left occipital lobe consistent with additional infarct which appears subacute in chronicity. There is no acute intracranial hemorrhage or extra-axial fluid collection. Parenchymal volume is within expected limits for age. The ventricles are normal in size. Patchy FLAIR signal abnormality throughout the remainder of the supratentorial white matter likely reflects sequela of moderate chronic small-vessel ischemic change. The pituitary and suprasellar region are normal. There is no mass lesion. There is no mass effect or midline shift. Vascular: The major flow voids are normal. The vasculature is assessed  in full below. Skull and upper cervical spine: Normal marrow signal. Sinuses/Orbits: The paranasal sinuses are clear. Bilateral lens implants are in place. The globes and orbits are otherwise unremarkable. Other: None. MRA HEAD FINDINGS Anterior circulation: The intracranial ICAs are patent with atherosclerotic irregularity resulting in mild-to-moderate stenosis, left worse than right. The bilateral MCAs are patent, without proximal high-grade stenosis or occlusion. The left A1 segment is occluded. The right A1 segment is normal. The anterior communicating artery is normal. The distal ACA branches appear patent but with multifocal high-grade stenoses of the right A2 and A3 segments. There is no aneurysm or AVM. Posterior  circulation: The V4 segments are not included within the field of view. The basilar artery is patent. There is multifocal high-grade stenosis/occlusion of the left P2 segment with no flow related enhancement in the distal PCA branches. The right P1 segment is patent there is occlusion of a superior right P2/P3 segment after a bifurcation. There is no aneurysm or AVM. Anatomic variants: None. IMPRESSION: 1. Small acute to early subacute infarct in the right lentiform nucleus, and additional subacute appearing cortical infarct along the undersurface of the left occipital lobe. 2. Occluded left A1 segment and multifocal high-grade stenosis of the right A2 and A3 segments. 3. Multifocal high-grade stenosis/occlusion of the left P2 segment with no flow related enhancement in the distal PCA branches. 4. Occlusion of the superior right P2/P3 segment. Electronically Signed   By: Lesia Hausen M.D.   On: 07/23/2022 14:31   MR ANGIO HEAD WO CONTRAST  Result Date: 07/23/2022 CLINICAL DATA:  TIA EXAM: MRI HEAD WITHOUT CONTRAST MRA HEAD WITHOUT CONTRAST TECHNIQUE: Multiplanar, multi-echo pulse sequences of the brain and surrounding structures were acquired without intravenous contrast. Angiographic images of the Circle of Willis were acquired using MRA technique without intravenous contrast. COMPARISON:  None Available. FINDINGS: MRI HEAD FINDINGS Brain: There is a small focus of diffusion restriction in the right lentiform nucleus with associated FLAIR signal abnormality consistent with acute to early subacute infarct. There is no hemorrhage or mass effect. There is additional curvilinear diffusion restriction with FLAIR signal abnormality along the undersurface of the left occipital lobe consistent with additional infarct which appears subacute in chronicity. There is no acute intracranial hemorrhage or extra-axial fluid collection. Parenchymal volume is within expected limits for age. The ventricles are normal in size.  Patchy FLAIR signal abnormality throughout the remainder of the supratentorial white matter likely reflects sequela of moderate chronic small-vessel ischemic change. The pituitary and suprasellar region are normal. There is no mass lesion. There is no mass effect or midline shift. Vascular: The major flow voids are normal. The vasculature is assessed in full below. Skull and upper cervical spine: Normal marrow signal. Sinuses/Orbits: The paranasal sinuses are clear. Bilateral lens implants are in place. The globes and orbits are otherwise unremarkable. Other: None. MRA HEAD FINDINGS Anterior circulation: The intracranial ICAs are patent with atherosclerotic irregularity resulting in mild-to-moderate stenosis, left worse than right. The bilateral MCAs are patent, without proximal high-grade stenosis or occlusion. The left A1 segment is occluded. The right A1 segment is normal. The anterior communicating artery is normal. The distal ACA branches appear patent but with multifocal high-grade stenoses of the right A2 and A3 segments. There is no aneurysm or AVM. Posterior circulation: The V4 segments are not included within the field of view. The basilar artery is patent. There is multifocal high-grade stenosis/occlusion of the left P2 segment with no flow related enhancement in the distal PCA branches.  The right P1 segment is patent there is occlusion of a superior right P2/P3 segment after a bifurcation. There is no aneurysm or AVM. Anatomic variants: None. IMPRESSION: 1. Small acute to early subacute infarct in the right lentiform nucleus, and additional subacute appearing cortical infarct along the undersurface of the left occipital lobe. 2. Occluded left A1 segment and multifocal high-grade stenosis of the right A2 and A3 segments. 3. Multifocal high-grade stenosis/occlusion of the left P2 segment with no flow related enhancement in the distal PCA branches. 4. Occlusion of the superior right P2/P3 segment.  Electronically Signed   By: Lesia Hausen M.D.   On: 07/23/2022 14:31    Pending Labs Unresulted Labs (From admission, onward)     Start     Ordered   08/01/22 0500  Creatinine, serum  (enoxaparin (LOVENOX)    CrCl >/= 30 ml/min)  Weekly,   R     Comments: while on enoxaparin therapy    07/25/22 0403   07/25/22 0500  Lipid panel  (Labs)  Tomorrow morning,   R       Comments: Fasting    07/25/22 0403   07/25/22 0020  Blood gas, venous (at [facility] and [facility])  Once,   R        07/25/22 0019            Vitals/Pain Today's Vitals   07/25/22 0906 07/25/22 0910 07/25/22 1004 07/25/22 1004  BP:   111/79   Pulse:  100 (!) 107   Resp:  (!) 22 20   Temp: (!) 97.4 F (36.3 C)     TempSrc: Axillary     SpO2:  97% 97%   Weight:      Height:      PainSc:    0-No pain    Isolation Precautions No active isolations  Medications Medications  ticagrelor (BRILINTA) tablet 90 mg (has no administration in time range)  aspirin chewable tablet 81 mg (has no administration in time range)   stroke: early stages of recovery book (has no administration in time range)  acetaminophen (TYLENOL) tablet 650 mg (has no administration in time range)    Or  acetaminophen (TYLENOL) 160 MG/5ML solution 650 mg (has no administration in time range)    Or  acetaminophen (TYLENOL) suppository 650 mg (has no administration in time range)  senna-docusate (Senokot-S) tablet 1 tablet (has no administration in time range)  enoxaparin (LOVENOX) injection 40 mg (40 mg Subcutaneous Given 07/25/22 1033)  sodium chloride 0.9 % bolus 500 mL (0 mLs Intravenous Stopped 07/25/22 0502)  iohexol (OMNIPAQUE) 350 MG/ML injection 75 mL (75 mLs Intravenous Contrast Given 07/25/22 0347)    Mobility non-ambulatory     Focused Assessments Neuro Assessment Handoff:  Swallow screen pass? Yes    NIH Stroke Scale  Dizziness Present: No Headache Present: No Interval: Initial Level of Consciousness (1a.)   : Alert, keenly  responsive LOC Questions (1b. )   : Answers one question correctly LOC Commands (1c. )   : Performs both tasks correctly Best Gaze (2. )  : Normal Visual (3. )  : No visual loss Facial Palsy (4. )    : Minor paralysis Motor Arm, Left (5a. )   : Drift Motor Arm, Right (5b. ) : No drift Motor Leg, Left (6a. )  : Some effort against gravity Motor Leg, Right (6b. ) : Drift Limb Ataxia (7. ): Present in two limbs Sensory (8. )  : Mild-to-moderate sensory loss, patient feels  pinprick is less sharp or is dull on the affected side, or there is a loss of superficial pain with pinprick, but patient is aware of being touched Best Language (9. )  : No aphasia Dysarthria (10. ): Mild-to-moderate dysarthria, patient slurs at least some words and, at worst, can be understood with some difficulty Extinction/Inattention (11.)   : No Abnormality Complete NIHSS TOTAL: 10 Last date known well: 08/05/2022 Last time known well: 1800 Neuro Assessment:   Neuro Checks:   Initial (07/25/22 0003)  Has TPA been given? No If patient is a Neuro Trauma and patient is going to OR before floor call report to 4N Charge nurse: [phone] or [phone]   R Recommendations: See Admitting Provider Note  Report given to:   Additional Notes:  RODENY DEUSCHLE is a 89 y.o. male with past medical history as below, significant for GERD, hard of hearing, stroke who presents to the ED with complaint of strokelike symptoms.  Patient was admitted at [facility] yesterday secondary to acute stroke.  MRI found to have small acute to early subacute infarct of right lentiform nucleus and subacute cortical infarct in left occipital lobe, he was admitted and subsequently discharged around 4:00 today.  He is on Brilinta.  Around 6 PM began having difficulty speaking, left-sided weakness, EMS was called and patient was compelled to come to the hospital by his family.  Patient is DNR/DNI.

## 2022-07-25 NOTE — ED Notes (Signed)
Dr. Dahal at bedside. 

## 2022-07-25 NOTE — ED Notes (Signed)
Pt agitated, pulling off gown, monitoring devices, grabbing at staff; pt incontinent of urine; pt cleaned, brief and linen changed

## 2022-07-25 NOTE — Significant Event (Signed)
Patient continues to get out of bed from his right side.Trying to pull patient up and reposition is difficult as patient is constantly swinging at staff. Patient's banged his right forearm against the bed-rail and endured skin tears along his right forearm and wrist. RN cleansed his wounds and placed xeroform and foam dressing on the skin tears. RN removed his black watch from his right wrist as it is rubbing against patient's skin and causing more skin tears. RN placed the watch inside the plastic bag along with the hearing aid batteries. Patient's friend Devon Davis took his clothes home earlier around 1600.   Patient is very confused and combative. Haldol was given. Bed alarm remains on.

## 2022-07-25 NOTE — H&P (Signed)
PCP:   Ignatius Specking, MD   Chief Complaint:  Slurred speech  HPI: This is a 89 year old male that lives alone at home.  He is around-the-clock caregivers.  Past medical history includes mild dementia and some physical debility.  Patient was admitted at [facility] 5/2 to 5/3 with acute CVA.  Patient discharged home on DAPT.  On arriving home patient was fine.  Caregiver left.  When he returned at 10 PM patient was dysarthric, unable to stand, facial droop, left-sided weakness.  He was returned to the ER.  History provided by caregiver at bedside.  Review of Systems:  Per HPI  Past Medical History: Past Medical History:  Diagnosis Date   Arthritis    Bladder stone    GERD (gastroesophageal reflux disease)    History of kidney stones    HOH (hard of hearing)    Stroke [facility] 2008   Past Surgical History:  Procedure Laterality Date   BACK SURGERY     Dr Renaye Rakers   CYSTOSCOPY WITH LITHOLAPAXY N/A 07/26/2020   Procedure: CYSTOSCOPY WITH LITHOLAPAXY AND FULGERATION;  Surgeon: Bjorn Pippin, MD;  Location: [facility];  Service: Urology;  Laterality: N/A;   LITHOTRIPSY     PROSTATE BIOPSY N/A 07/26/2020   Procedure: BIOPSY TRANSRECTAL ULTRASONIC PROSTATE (TUBP);  Surgeon: Bjorn Pippin, MD;  Location: [facility];  Service: Urology;  Laterality: N/A;    Medications: Prior to Admission medications   Medication Sig Start Date End Date Taking? Authorizing Provider  acetaminophen (TYLENOL) 325 MG tablet Take 2 tablets (650 mg total) by mouth every 4 (four) hours as needed for mild pain, headache or fever (or temp > 37.5 C (99.5 F)). 07/29/2022   Shon Hale, MD  aspirin EC 81 MG tablet Take 1 tablet (81 mg total) by mouth daily with breakfast. 07/23/2022   Mariea Clonts, Courage, MD  atorvastatin (LIPITOR) 40 MG tablet Take 1 tablet (40 mg total) by mouth daily. 07/25/22   Shon Hale, MD  clopidogrel (PLAVIX) 75 MG tablet Take 1 tablet (75 mg total) by mouth daily. 08/24/22   Shon Hale, MD  Multiple  Vitamins-Minerals (CENTRUM SILVER 50+MEN PO) Take 1 tablet by mouth daily.    [provider]  pantoprazole (PROTONIX) 40 MG tablet Take 1 tablet (40 mg total) by mouth daily. 07/25/2022   Shon Hale, MD  ticagrelor (BRILINTA) 90 MG TABS tablet Take 1 tablet (90 mg total) by mouth 2 (two) times daily. For 1 month only 08/21/2022 08/23/22  Shon Hale, MD    Allergies:  No Known Allergies  Social History:  reports that he has never smoked. He has never used smokeless tobacco. He reports current alcohol use. He reports that he does not use drugs.  Family History: History reviewed. No pertinent family history.  Physical Exam: Vitals:   07/25/22 0115 07/25/22 0127 07/25/22 0145 07/25/22 0215  BP: (!) 172/70  (!) 161/65 (!) 153/72  Pulse: 74  81 79  Resp: 17  20 (!) 21  Temp:      TempSrc:      SpO2: 98%  99% 98%  Weight:  87.9 kg    Height:  5\' 9"  (1.753 m)      General:  Alert and oriented times two, well developed and nourished, no acute distress Eyes: PERRLA, pink conjunctiva, no scleral icterus ENT: Moist oral mucosa, neck supple, no thyromegaly Lungs: clear to ascultation, no wheeze, no crackles, no use of accessory muscles Cardiovascular: regular rate and rhythm, no regurgitation, no gallops,  no murmurs. No carotid bruits, no JVD Abdomen: soft, positive BS, non-tender, non-distended, no organomegaly, not an acute abdomen GU: not examined Neuro: CN II - XII grossly intact except, right-sided facial droop Musculoskeletal: Left-sided weakness strength 4/5 (LUE & LLE) no edema Skin: no rash, no subcutaneous crepitation, no decubitus Psych: appropriate patient  Labs on Admission:  Recent Labs    07/23/22 1200 07/30/2022 2355 07/25/22 0000 07/25/22 0023  NA 138 136 137 137  K 4.3 3.6 3.6 3.7  CL 104 103 105  --   CO2 25 22  --   --   GLUCOSE 142* 183* 181*  --   BUN 17 12 14   --   CREATININE 0.83 0.96 0.90  --   CALCIUM 9.2 9.3  --   --    Recent Labs     07/23/22 1200 07/29/2022 2355  AST 19 18  ALT 14 14  ALKPHOS 68 70  BILITOT 1.0 0.7  PROT 6.6 6.4*  ALBUMIN 3.8 3.5    Recent Labs    07/23/22 1200 07/25/2022 2355 07/25/22 0000 07/25/22 0023  WBC 6.6 7.1  --   --   NEUTROABS 4.7 5.1  --   --   HGB 15.0 14.2 14.3 14.3  HCT 45.0 42.8 42.0 42.0  MCV 100.7* 101.7*  --   --   PLT 196 189  --   --     Recent Labs    07/27/2022 0442  HGBA1C 6.6*   Recent Labs    08/01/2022 0442 07/25/22 0307  CHOL 170 159  HDL 38* 41  LDLCALC 101* 94  TRIG 157* 119  CHOLHDL 4.5 3.9    Radiological Exams on Admission: DG Chest Portable 1 View  Result Date: 07/25/2022 CLINICAL DATA:  Difficulty breathing EXAM: PORTABLE CHEST 1 VIEW COMPARISON:  01/30/2017 FINDINGS: Cardiomegaly. Mediastinal contours within normal limits. Aortic atherosclerosis. No confluent airspace opacities, effusions or edema. IMPRESSION: Mild cardiomegaly.  No active disease. Electronically Signed   By: Charlett Nose M.D.   On: 07/25/2022 01:10   CT HEAD CODE STROKE WO CONTRAST  Result Date: 07/25/2022 CLINICAL DATA:  Code stroke. EXAM: CT HEAD WITHOUT CONTRAST TECHNIQUE: Contiguous axial images were obtained from the base of the skull through the vertex without intravenous contrast. RADIATION DOSE REDUCTION: This exam was performed according to the departmental dose-optimization program which includes automated exposure control, adjustment of the mA and/or kV according to patient size and/or use of iterative reconstruction technique. COMPARISON:  No prior CT head available, correlation is made with MRI head 07/23/2022 FINDINGS: Brain: No evidence of acute infarction, hemorrhage, mass, mass effect, or midline shift. No hydrocephalus or extra-axial collection. Periventricular white matter changes, likely the sequela of chronic small vessel ischemic disease. Vascular: No hyperdense vessel. Atherosclerotic calcifications in the intracranial carotid and vertebral arteries. Skull:  Negative for fracture or focal lesion. Sinuses/Orbits: No acute finding. Other: The mastoid air cells are well aerated. ASPECTS [facility] Stroke Program Early CT Score) - Ganglionic level infarction (caudate, lentiform nuclei, internal capsule, insula, M1-M3 cortex): 7 - Supraganglionic infarction (M4-M6 cortex): 3 Total score (0-10 with 10 being normal): 10 IMPRESSION: 1. No acute intracranial process. 2. ASPECTS is 10. Imaging results were communicated on 07/25/2022 at 12:05 am to provider BHAGAT via secure text paging. Electronically Signed   By: Wiliam Ke M.D.   On: 07/25/2022 00:05   ECHOCARDIOGRAM COMPLETE  Result Date: 08/04/2022    ECHOCARDIOGRAM REPORT   Patient Name:   JAQUANTE KIRSCHENMANN Date  of Exam: 08-16-2022 Medical Rec #:  [id number]          Height:       69.0 in Accession #:    [id number]         Weight:       193.8 lb Date of Birth:  22-Jun-1921         BSA:          2.038 m Patient Age:    89 years           BP:           169/75 mmHg Patient Gender: M                  HR:           79 bpm. [facility] Procedure: 2D Echo, Cardiac Doppler, Color Doppler and Intracardiac            Opacification Agent Indications:   Stroke  History:       Patient has no prior history of Echocardiogram examinations.                Stroke.  Sonographer:   Milda Smart Referring      [id number] Onnie Boer Phys:  Sonographer Comments: Technically difficult study due to poor echo windows. Image acquisition challenging due to patient body habitus and Image acquisition challenging due to respiratory motion. IMPRESSIONS  1. Left ventricular ejection fraction, by estimation, is 55 to 60%. The left ventricle has normal function. The left ventricle has no regional wall motion abnormalities. Left ventricular diastolic parameters are consistent with Grade I diastolic dysfunction (impaired relaxation).  2. Right ventricular systolic function was not well visualized. The right ventricular size is normal.  There is normal pulmonary artery systolic pressure.  3. The mitral valve is normal in structure. No evidence of mitral valve regurgitation. No evidence of mitral stenosis.  4. The aortic valve was not well visualized. There is mild calcification of the aortic valve. Aortic valve regurgitation is mild to moderate. Mild aortic valve stenosis. Aortic valve mean gradient measures 12.0 mmHg. Aortic valve Vmax measures 2.24 m/s.  5. The inferior vena cava is normal in size with greater than 50% respiratory variability, suggesting right atrial pressure of 3 mmHg. Comparison(s): No prior Echocardiogram. FINDINGS  Left Ventricle: Left ventricular ejection fraction, by estimation, is 55 to 60%. The left ventricle has normal function. The left ventricle has no regional wall motion abnormalities. Definity contrast agent was given IV to delineate the left ventricular  endocardial borders. The left ventricular internal cavity size was normal in size. There is no left ventricular hypertrophy. Left ventricular diastolic parameters are consistent with Grade I diastolic dysfunction (impaired relaxation). Right Ventricle: The right ventricular size is normal. No increase in right ventricular wall thickness. Right ventricular systolic function was not well visualized. There is normal pulmonary artery systolic pressure. The tricuspid regurgitant velocity is  1.99 m/s, and with an assumed right atrial pressure of 3 mmHg, the estimated right ventricular systolic pressure is 18.8 mmHg. Left Atrium: Left atrial size was normal in size. Right Atrium: Right atrial size was normal in size. Pericardium: There is no evidence of pericardial effusion. Mitral Valve: The mitral valve is normal in structure. No evidence of mitral valve regurgitation. No evidence of mitral valve stenosis. Tricuspid Valve: The tricuspid valve is normal in structure. Tricuspid valve regurgitation is trivial. No evidence of tricuspid stenosis. Aortic Valve: The aortic  valve was not  well visualized. There is mild calcification of the aortic valve. Aortic valve regurgitation is mild to moderate. Mild aortic stenosis is present. Aortic valve mean gradient measures 12.0 mmHg. Aortic valve peak gradient measures 20.1 mmHg. Aortic valve area, by VTI measures 1.27 cm. Pulmonic Valve: The pulmonic valve was not well visualized. Pulmonic valve regurgitation is not visualized. No evidence of pulmonic stenosis. Aorta: The aortic root and ascending aorta are structurally normal, with no evidence of dilitation. Venous: The inferior vena cava is normal in size with greater than 50% respiratory variability, suggesting right atrial pressure of 3 mmHg. IAS/Shunts: The interatrial septum was not well visualized.  LEFT VENTRICLE PLAX 2D LVIDd:         5.30 cm   Diastology LVIDs:         3.80 cm   LV e' medial:    2.82 cm/s LV PW:         1.10 cm   LV E/e' medial:  12.3 LV IVS:        0.90 cm   LV e' lateral:   3.57 cm/s LVOT diam:     2.00 cm   LV E/e' lateral: 9.7 LV SV:         52 LV SV Index:   25 LVOT Area:     3.14 cm  RIGHT VENTRICLE RV S prime:     8.52 cm/s TAPSE (M-mode): 2.3 cm LEFT ATRIUM             Index        RIGHT ATRIUM           Index LA diam:        4.20 cm 2.06 cm/m   RA Area:     12.80 cm LA Vol (A2C):   53.4 ml 26.20 ml/m  RA Volume:   27.20 ml  13.34 ml/m LA Vol (A4C):   47.5 ml 23.30 ml/m LA Biplane Vol: 50.5 ml 24.77 ml/m  AORTIC VALVE AV Area (Vmax):    1.25 cm AV Area (Vmean):   1.19 cm AV Area (VTI):     1.27 cm AV Vmax:           224.00 cm/s AV Vmean:          167.000 cm/s AV VTI:            0.409 m AV Peak Grad:      20.1 mmHg AV Mean Grad:      12.0 mmHg LVOT Vmax:         88.80 cm/s LVOT Vmean:        63.100 cm/s LVOT VTI:          0.165 m LVOT/AV VTI ratio: 0.40  AORTA Ao Root diam: 3.40 cm Ao Asc diam:  3.40 cm MITRAL VALVE                TRICUSPID VALVE MV Area (PHT): 3.13 cm     TR Peak grad:   15.8 mmHg MV Decel Time: 242 msec     TR Vmax:         199.00 cm/s MV E velocity: 34.70 cm/s MV A velocity: 103.00 cm/s  SHUNTS MV E/A ratio:  0.34         Systemic VTI:  0.16 m                             Systemic Diam: 2.00 cm Vishnu Priya Mallipeddi Electronically  signed by Winfield Rast Mallipeddi Signature Date/Time: 07/29/2022/2:54:40 PM    Final    US Carotid Bilateral (at [facility] and [facility] only)  Result Date: 07/30/2022 CLINICAL DATA:  CVA.  Transient left lower extremity weakness. EXAM: BILATERAL CAROTID DUPLEX ULTRASOUND TECHNIQUE: Wallace Cullens scale imaging, color Doppler and duplex ultrasound were performed of bilateral carotid and vertebral arteries in the neck. COMPARISON:  None Available. FINDINGS: Criteria: Quantification of carotid stenosis is based on velocity parameters that correlate the residual internal carotid diameter with NASCET-based stenosis levels, using the diameter of the distal internal carotid lumen as the denominator for stenosis measurement. The following velocity measurements were obtained: RIGHT ICA: 68/22 cm/sec CCA: 68/7 cm/sec SYSTOLIC ICA/CCA RATIO:  1.0 ECA: 102 cm/sec LEFT ICA: 40/7 cm/sec CCA: 74/7 cm/sec SYSTOLIC ICA/CCA RATIO:  0.5 ECA: 132 cm/sec RIGHT CAROTID ARTERY: There is no grayscale evidence of significant intimal thickening or atherosclerotic plaque affecting the interrogated portions of the right carotid system. There are no elevated peak systolic velocities within the interrogated course of the right internal carotid artery to suggest a hemodynamically significant stenosis. RIGHT VERTEBRAL ARTERY:  Antegrade flow LEFT CAROTID ARTERY: There is a minimal amount of eccentric echogenic plaque within left carotid bulb (images 48 and 50), extending to involve the origin and proximal aspects of the left internal carotid artery (image 58), not resulting in elevated peak systolic velocities within the interrogated course of the left internal carotid artery to suggest a hemodynamically significant stenosis. LEFT VERTEBRAL ARTERY:   Antegrade flow IMPRESSION: 1. Minimal amount of left-sided atherosclerotic plaque, not resulting in a hemodynamically significant stenosis. 2. Unremarkable sonographic evaluation of the right carotid system. Electronically Signed   By: Simonne Come M.D.   On: 07/25/2022 14:01   MR BRAIN WO CONTRAST  Result Date: 07/23/2022 CLINICAL DATA:  TIA EXAM: MRI HEAD WITHOUT CONTRAST MRA HEAD WITHOUT CONTRAST TECHNIQUE: Multiplanar, multi-echo pulse sequences of the brain and surrounding structures were acquired without intravenous contrast. Angiographic images of the Circle of Willis were acquired using MRA technique without intravenous contrast. COMPARISON:  None Available. FINDINGS: MRI HEAD FINDINGS Brain: There is a small focus of diffusion restriction in the right lentiform nucleus with associated FLAIR signal abnormality consistent with acute to early subacute infarct. There is no hemorrhage or mass effect. There is additional curvilinear diffusion restriction with FLAIR signal abnormality along the undersurface of the left occipital lobe consistent with additional infarct which appears subacute in chronicity. There is no acute intracranial hemorrhage or extra-axial fluid collection. Parenchymal volume is within expected limits for age. The ventricles are normal in size. Patchy FLAIR signal abnormality throughout the remainder of the supratentorial white matter likely reflects sequela of moderate chronic small-vessel ischemic change. The pituitary and suprasellar region are normal. There is no mass lesion. There is no mass effect or midline shift. Vascular: The major flow voids are normal. The vasculature is assessed in full below. Skull and upper cervical spine: Normal marrow signal. Sinuses/Orbits: The paranasal sinuses are clear. Bilateral lens implants are in place. The globes and orbits are otherwise unremarkable. Other: None. MRA HEAD FINDINGS Anterior circulation: The intracranial ICAs are patent with  atherosclerotic irregularity resulting in mild-to-moderate stenosis, left worse than right. The bilateral MCAs are patent, without proximal high-grade stenosis or occlusion. The left A1 segment is occluded. The right A1 segment is normal. The anterior communicating artery is normal. The distal ACA branches appear patent but with multifocal high-grade stenoses of the right A2 and A3 segments. There is no aneurysm  or AVM. Posterior circulation: The V4 segments are not included within the field of view. The basilar artery is patent. There is multifocal high-grade stenosis/occlusion of the left P2 segment with no flow related enhancement in the distal PCA branches. The right P1 segment is patent there is occlusion of a superior right P2/P3 segment after a bifurcation. There is no aneurysm or AVM. Anatomic variants: None. IMPRESSION: 1. Small acute to early subacute infarct in the right lentiform nucleus, and additional subacute appearing cortical infarct along the undersurface of the left occipital lobe. 2. Occluded left A1 segment and multifocal high-grade stenosis of the right A2 and A3 segments. 3. Multifocal high-grade stenosis/occlusion of the left P2 segment with no flow related enhancement in the distal PCA branches. 4. Occlusion of the superior right P2/P3 segment. Electronically Signed   By: Lesia Hausen M.D.   On: 07/23/2022 14:31   MR ANGIO HEAD WO CONTRAST  Result Date: 07/23/2022 CLINICAL DATA:  TIA EXAM: MRI HEAD WITHOUT CONTRAST MRA HEAD WITHOUT CONTRAST TECHNIQUE: Multiplanar, multi-echo pulse sequences of the brain and surrounding structures were acquired without intravenous contrast. Angiographic images of the Circle of Willis were acquired using MRA technique without intravenous contrast. COMPARISON:  None Available. FINDINGS: MRI HEAD FINDINGS Brain: There is a small focus of diffusion restriction in the right lentiform nucleus with associated FLAIR signal abnormality consistent with acute to  early subacute infarct. There is no hemorrhage or mass effect. There is additional curvilinear diffusion restriction with FLAIR signal abnormality along the undersurface of the left occipital lobe consistent with additional infarct which appears subacute in chronicity. There is no acute intracranial hemorrhage or extra-axial fluid collection. Parenchymal volume is within expected limits for age. The ventricles are normal in size. Patchy FLAIR signal abnormality throughout the remainder of the supratentorial white matter likely reflects sequela of moderate chronic small-vessel ischemic change. The pituitary and suprasellar region are normal. There is no mass lesion. There is no mass effect or midline shift. Vascular: The major flow voids are normal. The vasculature is assessed in full below. Skull and upper cervical spine: Normal marrow signal. Sinuses/Orbits: The paranasal sinuses are clear. Bilateral lens implants are in place. The globes and orbits are otherwise unremarkable. Other: None. MRA HEAD FINDINGS Anterior circulation: The intracranial ICAs are patent with atherosclerotic irregularity resulting in mild-to-moderate stenosis, left worse than right. The bilateral MCAs are patent, without proximal high-grade stenosis or occlusion. The left A1 segment is occluded. The right A1 segment is normal. The anterior communicating artery is normal. The distal ACA branches appear patent but with multifocal high-grade stenoses of the right A2 and A3 segments. There is no aneurysm or AVM. Posterior circulation: The V4 segments are not included within the field of view. The basilar artery is patent. There is multifocal high-grade stenosis/occlusion of the left P2 segment with no flow related enhancement in the distal PCA branches. The right P1 segment is patent there is occlusion of a superior right P2/P3 segment after a bifurcation. There is no aneurysm or AVM. Anatomic variants: None. IMPRESSION: 1. Small acute to early  subacute infarct in the right lentiform nucleus, and additional subacute appearing cortical infarct along the undersurface of the left occipital lobe. 2. Occluded left A1 segment and multifocal high-grade stenosis of the right A2 and A3 segments. 3. Multifocal high-grade stenosis/occlusion of the left P2 segment with no flow related enhancement in the distal PCA branches. 4. Occlusion of the superior right P2/P3 segment. Electronically Signed   By: Theron Arista  Noone M.D.   On: 07/23/2022 14:31    MRI Brain  07/25/2022 IMPRESSION: 1. Mild extension, enlargement of the right corona radiata/lentiform infarct since MRI two days ago. No associated hemorrhage or mass effect. No new areas of ischemia.    Assessment/Plan Present on Admission:  Acute progression CVA (cerebrovascular accident) (HCC) -ASA and Brilinta, per neuro recommendation -PT/OT/speech consult -Atorvastatin 40mg  daily continued per neuro -Neuro checks -Appreciate neuro input -CTA head and neck/echo not repeated -Permissive hypertension   GERD (gastroesophageal reflux disease)  Mild dementia  History of gout  Devon Davis 07/25/2022, 4:00 AM

## 2022-07-25 NOTE — ED Notes (Signed)
Courtesy call completed prior to transport to [facility]

## 2022-07-25 NOTE — Consult Note (Addendum)
Neurology Consultation Reason for Consult: Worsening left sided weakness Requesting Physician: Tanda Rockers   CC: Left sided weakness  History is obtained from: EMS, Caregiver, limited from patient given severe dysathria and hard of hearing at baseline   HPI: Devon Davis is a 89 y.o. male with a past medical history of recent right lacunar and left subacute inferior occipital stroke, prior strokes without significant residual on dual antiplatelet therapy chronically, hyperlipidemia, diabetes not on treatment (A1c 6.6%), remote hypertension, bilateral carpal tunnel syndrome  He had just been discharged from [facility] after having presented with left-sided weakness and slurred speech, which nearly fully resolved except for some mild residual blurred speech at discharge per primary caregiver.  Plan had been to switch from aspirin and Plavix to aspirin and Brilinta, but he had not yet taken his Brilinta.  His caregiver had just left the home at 6 PM to pick up the medications and on his return to 1 PM the patient was acutely symptomatic.  The patient reported he felt he would be fine if he just took his medications and therefore his Plavix was administered to him, but the Brilinta was not yet started.  Due to ongoing symptoms EMS was activated and I evaluated the patient as a code stroke  At [facility] he received Plavix 75 mg at 1817 on 5/2 and at home he received plavix on 5/3 at ~11 PM  LKW: 6 PM Thrombolytic given?: No, recent strokes IA performed?: No, exam not consistent with LVO Premorbid modified rankin scale: 3-4     3 - Moderate disability. Requires some help, but able to walk unassisted (walker at baseline but without assistance from another person)   ROS: All other review of systems was negative except as noted in the HPI.   Past Medical History:  Diagnosis Date   Arthritis    Bladder stone    GERD (gastroesophageal reflux disease)    History of kidney stones     HOH (hard of hearing)    Stroke [facility]) 2008   Past Surgical History:  Procedure Laterality Date   BACK SURGERY     Dr Renaye Rakers   CYSTOSCOPY WITH LITHOLAPAXY N/A 07/26/2020   Procedure: CYSTOSCOPY WITH LITHOLAPAXY AND FULGERATION;  Surgeon: Bjorn Pippin, MD;  Location: [facility];  Service: Urology;  Laterality: N/A;   LITHOTRIPSY     PROSTATE BIOPSY N/A 07/26/2020   Procedure: BIOPSY TRANSRECTAL ULTRASONIC PROSTATE (TUBP);  Surgeon: Bjorn Pippin, MD;  Location: [facility];  Service: Urology;  Laterality: N/A;   Current Outpatient Medications  Medication Instructions   acetaminophen (TYLENOL) 650 mg, Oral, Every 4 hours PRN   aspirin EC 81 mg, Oral, Daily with breakfast   atorvastatin (LIPITOR) 40 mg, Oral, Daily   [START ON 08/24/2022] clopidogrel (PLAVIX) 75 mg, Oral, Daily   Multiple Vitamins-Minerals (CENTRUM SILVER 50+MEN PO) 1 tablet, Oral, Daily   pantoprazole (PROTONIX) 40 mg, Oral, Daily   ticagrelor (BRILINTA) 90 mg, Oral, 2 times daily, For 1 month only     No family history on file.  Social History:  reports that he has never smoked. He has never used smokeless tobacco. He reports current alcohol use. He reports that he does not use drugs.   Exam: Current vital signs: BP (!) 152/70 (BP Location: Right Arm)   Pulse 75   Temp 98.3 F (36.8 C) (Oral)   Resp (!) 23   SpO2 96%  Vital signs in last 24 hours: Temp:  [97.8 F (36.6  C)-98.3 F (36.8 C)] 98.3 F (36.8 C) (05/04 0018) Pulse Rate:  [74-81] 75 (05/04 0018) Resp:  [17-23] 23 (05/04 0018) BP: (136-169)/(70-75) 152/70 (05/04 0018) SpO2:  [95 %-99 %] 96 % (05/04 0018)   Physical Exam  Constitutional: Appears well-developed and well-nourished.  Psych: Affect pleasant and cooperative Eyes: No scleral injection HENT: No oropharyngeal obstruction.  MSK: no joint deformities.  Cardiovascular: Mildly bradycardic to the 50s but regular rhythm Respiratory: Effort normal, non-labored breathing initially but with laying flat for  CT scan he became quite dyspneic GI: Soft.  No distension. There is no tenderness.  Skin: Warm dry and intact visible skin  Neuro: Mental Status: Patient is awake, alert, oriented to person, month, but reported his age is 56 No signs of aphasia or neglect Cranial Nerves: II: Visual Fields are full to blink to threat, unreliable finger counting.  III,IV, VI: EOMI without ptosis or diploplia.  V: Facial sensation is symmetric to light touch VII: Facial movement is notable for marked left facial droop VIII: hearing is very hard of hearing  at baseline X: Uvula elevates symmetrically XI: Shoulder shrug is symmetric. XII: tongue is midline without atrophy or fasciculations.  Motor: No drift RUE, LUE drifts nearly but not quite to the bed. Bilateral LE drift w/o hitting the bed, more on the left than the right. Confrontational testing deferred due to dyspnea  Sensory: Sensation is reduced on the left arm and leg  Cerebellar: FNF and HKS are intact within limits of weakness  Gait:  Deferred   NIHSS total 8 to 9  Score breakdown: 1 point for age incorrect, 2 for left facial droop, 1 for LUE, 1 for LLE, 1 for sensory loss on the left, 1 to 2 for dysarthria (fluctuating during exam)  I have reviewed labs in epic and the results pertinent to this consultation are:  Basic Metabolic Panel: Recent Labs  Lab 07/23/22 1200 08/05/2022 2355 07/25/22 0000 07/25/22 0023  NA 138 136 137 137  K 4.3 3.6 3.6 3.7  CL 104 103 105  --   CO2 25 22  --   --   GLUCOSE 142* 183* 181*  --   BUN 17 12 14   --   CREATININE 0.83 0.96 0.90  --   CALCIUM 9.2 9.3  --   --     CBC: Recent Labs  Lab 07/23/22 1200 08/09/2022 2355 07/25/22 0000 07/25/22 0023  WBC 6.6 7.1  --   --   NEUTROABS 4.7 5.1  --   --   HGB 15.0 14.2 14.3 14.3  HCT 45.0 42.8 42.0 42.0  MCV 100.7* 101.7*  --   --   PLT 196 189  --   --     Coagulation Studies: Recent Labs    08/13/2022 2355  LABPROT 13.4  INR 1.0      I  have reviewed the images obtained:  Head CT personally reviewed, agree with radiology:   no acute intracranial process w/ chronic microvascular disease likely obscuring further acute stroke    Impression: Recurrent stroke secondary to known severe intracranial atherosclerotic disease vs. worsening of stuttering lacunar stroke  Recommendations: -Admission for PT/OT/SLP eval  -CTA head and neck to re-evaluate intracranial vasculature (MRA can overcall stenoses due to technical limitations)  -MRI brain to evaluate for potential cardioembolic pattern of strokes; could consider anticoagulation instead of antiplatelet if high suspicion for embolic phenomenon  -Tele monitoring -No need to repeat ECHO at this time, or stroke labs  -  NPO pending swallow screen -Aspirin 81 mg daily and Brilinta 90 mg twice daily for 1 month  followed by aspirin 81 mg daily and Plavix 75 mg daily for 2 months.  -Continue Atorvastatin 40 mg daily -Permissive hypertension to < 220/110 due to acute stroke for 24-48 hours  -Management of comorbidities including recently diagnosed diabetes per primary team -Given results of imaging below confirm worsening of stuttering lacunar stroke, no change to plan as above is indicated. Inpatient neurology will be available as needed, reach out if new questions/concerns arise  Addendum  MRI brain personally reviewed, agree with radiology:   1. Mild extension, enlargement of the right corona radiata/lentiform infarct since MRI two days ago. No associated hemorrhage or mass effect. No new areas of ischemia. 2. Stable ischemic changes elsewhere.   CTA personally reviewed, agree with radiology:   1. Severe, near occlusive stenosis in the proximal and mid left P2, with poor flow in the left P3 segments. 2. Severe stenosis in the distal left cavernous and proximal left supraclinoid ICA. 3. Severe stenosis in the proximal right M1 and moderate stenosis in the distal right M1. Multifocal  mild stenosis in the right MCA branches. 4. Mild stenosis in the proximal right P1 and proximal right P2. 5. No hemodynamically significant stenosis in the neck. 6. Aortic atherosclerosis.   Brooke Dare MD-PhD [facility] [phone] Available 7 PM to 7 AM, outside of these hours please call Neurologist on call as listed on Amion.   CRITICAL CARE Performed by: Gordy Councilman   Total critical care time: 35 minutes  Critical care time was exclusive of separately billable procedures and treating other patients.  Critical care was necessary to treat or prevent imminent or life-threatening deterioration; emergent eval for consideration of thrombectomy or thrombolytic   Critical care was time spent personally by me on the following activities: development of treatment plan with patient and/or surrogate as well as nursing, discussions with consultants, evaluation of patient's response to treatment, examination of patient, obtaining history from patient or surrogate, ordering and performing treatments and interventions, ordering and review of laboratory studies, ordering and review of radiographic studies, pulse oximetry and re-evaluation of patient's condition.

## 2022-07-25 NOTE — ED Notes (Addendum)
ED TO INPATIENT HANDOFF REPORT  ED Nurse Name and Phone #:  Morrie Sheldon [phone]  S Name/Age/Gender Devon Davis 89 y.o. male Room/Bed: 002C/002C  Code Status   Code Status: DNR  Home/SNF/Other Home Patient oriented to: self and place Is this baseline? No   Triage Complete: Triage complete  Chief Complaint CVA (cerebral vascular accident) [facility]) [I63.9]  Triage Note The pt was just discharged from [place] this afternoon  his caregiver reports that the pt was  lsn at 1800  and when he came back to the house at 300 the pts speech was different and he had some lt sided weakness   Allergies No Known Allergies  Level of Care/Admitting Diagnosis ED Disposition     ED Disposition  Admit   Condition  --   Comment  Hospital Area: [facility] [[id number]]  Level of Care: Telemetry Medical [[id number]]  May admit patient to [facility] or [facility] if equivalent level of care is available:: No  Covid Evaluation: Confirmed COVID Negative  Diagnosis: CVA (cerebral vascular accident) [facility]) [[id number]]  Admitting Physician: Gery Pray [[id number]]  Attending Physician: Gery Pray [[id number]]  Certification:: I certify this patient will need inpatient services for at least 2 midnights  Estimated Length of Stay: 2          B Medical/Surgery History Past Medical History:  Diagnosis Date   Arthritis    Bladder stone    GERD (gastroesophageal reflux disease)    History of kidney stones    HOH (hard of hearing)    Stroke [facility]) 2008   Past Surgical History:  Procedure Laterality Date   BACK SURGERY     Dr Renaye Rakers   CYSTOSCOPY WITH LITHOLAPAXY N/A 07/26/2020   Procedure: CYSTOSCOPY WITH LITHOLAPAXY AND FULGERATION;  Surgeon: Bjorn Pippin, MD;  Location: [facility];  Service: Urology;  Laterality: N/A;   LITHOTRIPSY     PROSTATE BIOPSY N/A 07/26/2020   Procedure: BIOPSY TRANSRECTAL ULTRASONIC PROSTATE (TUBP);  Surgeon: Bjorn Pippin, MD;  Location: [facility];  Service: Urology;   Laterality: N/A;     A IV Location/Drains/Wounds Patient Lines/Drains/Airways Status     Active Line/Drains/Airways     Name Placement date Placement time Site Days   Peripheral IV 07/25/22 20 G Left Antecubital 07/25/22  0255  Antecubital  less than 1            Intake/Output Last 24 hours  Intake/Output Summary (Last 24 hours) at 07/25/2022 1347 Last data filed at 07/25/2022 0502 Gross per 24 hour  Intake 500 ml  Output --  Net 500 ml    Labs/Imaging Results for orders placed or performed during the hospital encounter of 07/31/2022 (from the past 48 hour(s))  CBG monitoring, ED     Status: Abnormal   Collection Time: 07/22/2022 11:53 PM  Result Value Ref Range   Glucose-Capillary 187 (H) 70 - 99 mg/dL    Comment: Glucose reference range applies only to samples taken after fasting for at least 8 hours.  Ethanol     Status: None   Collection Time: 08/08/2022 11:55 PM  Result Value Ref Range   Alcohol, Ethyl (B) <10 <10 mg/dL    Comment: (NOTE) Lowest detectable limit for serum alcohol is 10 mg/dL.  For medical purposes only. Performed at [facility], [address], [place], [place] [address]   Protime-INR     Status: None   Collection Time: 08/16/2022 11:55 PM  Result Value Ref Range  Prothrombin Time 13.4 11.4 - 15.2 seconds   INR 1.0 0.8 - 1.2    Comment: (NOTE) INR goal varies based on device and disease states. Performed at [facility], [address] [place], [place] [address]   APTT     Status: None   Collection Time: 08-12-2022 11:55 PM  Result Value Ref Range   aPTT 27 24 - 36 seconds    Comment: Performed at [facility], [address], [place], [place] [address]  CBC     Status: Abnormal   Collection Time: 2022-08-12 11:55 PM  Result Value Ref Range   WBC 7.1 4.0 - 10.5 K/uL   RBC 4.21 (L) 4.22 - 5.81 MIL/uL   Hemoglobin 14.2 13.0 - 17.0 g/dL   HCT 57.8 46.9 - 62.9 %   MCV 101.7 (H) 80.0 - 100.0 fL   MCH 33.7 26.0 - 34.0 pg    MCHC 33.2 30.0 - 36.0 g/dL   RDW 52.8 41.3 - 24.4 %   Platelets 189 150 - 400 K/uL   nRBC 0.0 0.0 - 0.2 %    Comment: Performed at [facility], [address], [place], [place] [address]  Differential     Status: None   Collection Time: 08/12/22 11:55 PM  Result Value Ref Range   Neutrophils Relative % 72 %   Neutro Abs 5.1 1.7 - 7.7 K/uL   Lymphocytes Relative 13 %   Lymphs Abs 1.0 0.7 - 4.0 K/uL   Monocytes Relative 12 %   Monocytes Absolute 0.8 0.1 - 1.0 K/uL   Eosinophils Relative 2 %   Eosinophils Absolute 0.2 0.0 - 0.5 K/uL   Basophils Relative 1 %   Basophils Absolute 0.1 0.0 - 0.1 K/uL   Immature Granulocytes 0 %   Abs Immature Granulocytes 0.02 0.00 - 0.07 K/uL    Comment: Performed at [facility], [address] [place], [place] [address]  Comprehensive metabolic panel     Status: Abnormal   Collection Time: 08/12/2022 11:55 PM  Result Value Ref Range   Sodium 136 135 - 145 mmol/L   Potassium 3.6 3.5 - 5.1 mmol/L   Chloride 103 98 - 111 mmol/L   CO2 22 22 - 32 mmol/L   Glucose, Bld 183 (H) 70 - 99 mg/dL    Comment: Glucose reference range applies only to samples taken after fasting for at least 8 hours.   BUN 12 8 - 23 mg/dL   Creatinine, Ser 4.40 0.61 - 1.24 mg/dL   Calcium 9.3 8.9 - 34.7 mg/dL   Total Protein 6.4 (L) 6.5 - 8.1 g/dL   Albumin 3.5 3.5 - 5.0 g/dL   AST 18 15 - 41 U/L   ALT 14 0 - 44 U/L   Alkaline Phosphatase 70 38 - 126 U/L   Total Bilirubin 0.7 0.3 - 1.2 mg/dL   GFR, Estimated >42 >59 mL/min    Comment: (NOTE) Calculated using the CKD-EPI Creatinine Equation (2021)    Anion gap 11 5 - 15    Comment: Performed at [facility], [address], [place], [place] [address]  Brain natriuretic peptide     Status: Abnormal   Collection Time: 2022-08-12 11:55 PM  Result Value Ref Range   B Natriuretic Peptide 254.1 (H) 0.0 - 100.0 pg/mL    Comment: Performed at [facility], [address] [place], [place] [address]   Troponin I (High Sensitivity)  Status: Abnormal   Collection Time: 07/31/2022 11:55 PM  Result Value Ref Range   Troponin I (High Sensitivity) 23 (H) <18 ng/L    Comment: (NOTE) Elevated high sensitivity troponin I (hsTnI) values and significant  changes across serial measurements may suggest ACS but many other  chronic and acute conditions are known to elevate hsTnI results.  Refer to the "Links" section for chest pain algorithms and additional  guidance. Performed at [facility], [address] [place], [place] [address]   I-stat chem 8, ED     Status: Abnormal   Collection Time: 07/25/22 12:00 AM  Result Value Ref Range   Sodium 137 135 - 145 mmol/L   Potassium 3.6 3.5 - 5.1 mmol/L   Chloride 105 98 - 111 mmol/L   BUN 14 8 - 23 mg/dL   Creatinine, Ser 6.04 0.61 - 1.24 mg/dL   Glucose, Bld 540 (H) 70 - 99 mg/dL    Comment: Glucose reference range applies only to samples taken after fasting for at least 8 hours.   Calcium, Ion 1.15 1.15 - 1.40 mmol/L   TCO2 22 22 - 32 mmol/L   Hemoglobin 14.3 13.0 - 17.0 g/dL   HCT 98.1 19.1 - 47.8 %  I-Stat venous blood gas, ED     Status: Abnormal   Collection Time: 07/25/22 12:23 AM  Result Value Ref Range   pH, Ven 7.486 (H) 7.25 - 7.43   pCO2, Ven 30.1 (L) 44 - 60 mmHg   pO2, Ven 106 (H) 32 - 45 mmHg   Bicarbonate 22.8 20.0 - 28.0 mmol/L   TCO2 24 22 - 32 mmol/L   O2 Saturation 99 %   Acid-Base Excess 0.0 0.0 - 2.0 mmol/L   Sodium 137 135 - 145 mmol/L   Potassium 3.7 3.5 - 5.1 mmol/L   Calcium, Ion 1.15 1.15 - 1.40 mmol/L   HCT 42.0 39.0 - 52.0 %   Hemoglobin 14.3 13.0 - 17.0 g/dL   Sample type VENOUS   Lipid panel     Status: None   Collection Time: 07/25/22  3:07 AM  Result Value Ref Range   Cholesterol 159 0 - 200 mg/dL   Triglycerides 295 <621 mg/dL   HDL 41 >30 mg/dL   Total CHOL/HDL Ratio 3.9 RATIO   VLDL 24 0 - 40 mg/dL   LDL Cholesterol 94 0 - 99 mg/dL    Comment:        Total Cholesterol/HDL:CHD  Risk Coronary Heart Disease Risk Table                     Men   Women  1/2 Average Risk   3.4   3.3  Average Risk       5.0   4.4  2 X Average Risk   9.6   7.1  3 X Average Risk  23.4   11.0        Use the calculated Patient Ratio above and the CHD Risk Table to determine the patient's CHD Risk.        ATP III CLASSIFICATION (LDL):  <100     mg/dL   Optimal  865-784  mg/dL   Near or Above                    Optimal  130-159  mg/dL   Borderline  696-295  mg/dL   High  >284     mg/dL   Very High Performed at  [facility], [address] [place], [place] [address]   Troponin I (High Sensitivity)     Status: Abnormal   Collection Time: 07/25/22  3:07 AM  Result Value Ref Range   Troponin I (High Sensitivity) 24 (H) <18 ng/L    Comment: (NOTE) Elevated high sensitivity troponin I (hsTnI) values and significant  changes across serial measurements may suggest ACS but many other  chronic and acute conditions are known to elevate hsTnI results.  Refer to the "Links" section for chest pain algorithms and additional  guidance. Performed at [facility], [address] [place], [place] [address]   Comprehensive metabolic panel     Status: Abnormal   Collection Time: 07/25/22  3:07 AM  Result Value Ref Range   Sodium 136 135 - 145 mmol/L   Potassium 3.9 3.5 - 5.1 mmol/L   Chloride 107 98 - 111 mmol/L   CO2 24 22 - 32 mmol/L   Glucose, Bld 146 (H) 70 - 99 mg/dL    Comment: Glucose reference range applies only to samples taken after fasting for at least 8 hours.   BUN 13 8 - 23 mg/dL   Creatinine, Ser 0.98 0.61 - 1.24 mg/dL   Calcium 8.8 (L) 8.9 - 10.3 mg/dL   Total Protein 5.3 (L) 6.5 - 8.1 g/dL   Albumin 3.2 (L) 3.5 - 5.0 g/dL   AST 17 15 - 41 U/L   ALT 14 0 - 44 U/L   Alkaline Phosphatase 68 38 - 126 U/L   Total Bilirubin 0.9 0.3 - 1.2 mg/dL   GFR, Estimated >11 >91 mL/min    Comment: (NOTE) Calculated using the CKD-EPI Creatinine Equation (2021)    Anion  gap 5 5 - 15    Comment: Performed at [facility], [address], [place], [place] [address]  Urine rapid drug screen (hosp performed)     Status: None   Collection Time: 07/25/22  5:18 AM  Result Value Ref Range   Opiates NONE DETECTED NONE DETECTED   Cocaine NONE DETECTED NONE DETECTED   Benzodiazepines NONE DETECTED NONE DETECTED   Amphetamines NONE DETECTED NONE DETECTED   Tetrahydrocannabinol NONE DETECTED NONE DETECTED   Barbiturates NONE DETECTED NONE DETECTED    Comment: (NOTE) DRUG SCREEN FOR MEDICAL PURPOSES ONLY.  IF CONFIRMATION IS NEEDED FOR ANY PURPOSE, NOTIFY LAB WITHIN 5 DAYS.  LOWEST DETECTABLE LIMITS FOR URINE DRUG SCREEN Drug Class                     Cutoff (ng/mL) Amphetamine and metabolites    1000 Barbiturate and metabolites    200 Benzodiazepine                 200 Opiates and metabolites        300 Cocaine and metabolites        300 THC                            50 Performed at [facility], [address], [place], [place] [address]   Urinalysis, Routine w reflex microscopic -Urine, Clean Catch     Status: Abnormal   Collection Time: 07/25/22  5:18 AM  Result Value Ref Range   Color, Urine YELLOW YELLOW   APPearance CLEAR CLEAR   Specific Gravity, Urine 1.039 (H) 1.005 - 1.030   pH 5.0 5.0 - 8.0   Glucose, UA  NEGATIVE NEGATIVE mg/dL   Hgb urine dipstick NEGATIVE NEGATIVE   Bilirubin Urine NEGATIVE NEGATIVE   Ketones, ur NEGATIVE NEGATIVE mg/dL   Protein, ur NEGATIVE NEGATIVE mg/dL   Nitrite NEGATIVE NEGATIVE   Leukocytes,Ua NEGATIVE NEGATIVE    Comment: Performed at [facility], [address] [place], [place] [address]   MR BRAIN WO CONTRAST  Result Date: 07/25/2022 CLINICAL DATA:  89 year old male left side weakness, speech difficulty. Small recent right corona radiata/lentiform ischemia on MRI 2 days ago. Near occluded left PCA, severe distal left ICA and right MCA stenoses by CTA today. EXAM: MRI HEAD WITHOUT CONTRAST  TECHNIQUE: Multiplanar, multiecho pulse sequences of the brain and surrounding structures were obtained without intravenous contrast. COMPARISON:  CTA head and neck 0337 hours today. Recent brain MRI 07/23/2022. FINDINGS: Brain: Coronal T2 and axial T1 weighted imaging today is motion degraded despite repeated imaging attempts. Mild progression of patchy, linear diffusion restriction tracking from the posterior right corona radiata into the right lentiform (series 5, image 79 today versus series 5, image 19 on 07/23/2022. But no convincing new areas of restricted diffusion. Stable developing encephalomalacia in the posterior left temporal and occipital lobes compatible with nonacute ischemia (faint abnormal trace diffusion and primarily facilitated on ADC). Mild if any hemosiderin there. And stable chronic small vessel disease elsewhere including bilateral cerebral white matter, bilateral deep gray matter nuclei chronic lacune. Brainstem and cerebellum relatively spared. No other convincing cortical encephalomalacia or chronic cerebral blood products. No midline shift, mass effect, evidence of mass lesion, ventriculomegaly, extra-axial collection or acute intracranial hemorrhage. Cervicomedullary junction and pituitary are within normal limits. Vascular: Major intracranial vascular flow voids are stable. Skull and upper cervical spine: Negative for age visible cervical spine. Visualized bone marrow signal is within normal limits. Sinuses/Orbits: Stable, negative. Other: None. IMPRESSION: 1. Mild extension, enlargement of the right corona radiata/lentiform infarct since MRI two days ago. No associated hemorrhage or mass effect. No new areas of ischemia. 2. Stable ischemic changes elsewhere. Electronically Signed   By: Odessa Fleming M.D.   On: 07/25/2022 04:27   CT ANGIO HEAD NECK W WO CM  Result Date: 07/25/2022 CLINICAL DATA:  Left-sided weakness, speech difficulties EXAM: CT ANGIOGRAPHY HEAD AND NECK WITH AND WITHOUT  CONTRAST TECHNIQUE: Multidetector CT imaging of the head and neck was performed using the standard protocol during bolus administration of intravenous contrast. Multiplanar CT image reconstructions and MIPs were obtained to evaluate the vascular anatomy. Carotid stenosis measurements (when applicable) are obtained utilizing NASCET criteria, using the distal internal carotid diameter as the denominator. RADIATION DOSE REDUCTION: This exam was performed according to the departmental dose-optimization program which includes automated exposure control, adjustment of the mA and/or kV according to patient size and/or use of iterative reconstruction technique. CONTRAST:  75mL OMNIPAQUE IOHEXOL 350 MG/ML SOLN COMPARISON:  No prior CTA available, correlation is made with CT head 07/25/2022 FINDINGS: CT HEAD FINDINGS For noncontrast findings, please see 07/25/2022 CT head. CTA NECK FINDINGS Aortic arch: Standard branching. Imaged portion shows no evidence of aneurysm or dissection. No significant stenosis of the major arch vessel origins. Aortic atherosclerosis. Right carotid system: No evidence of dissection, occlusion, or hemodynamically significant stenosis (greater than 50%). Left carotid system: No evidence of dissection, occlusion, or hemodynamically significant stenosis (greater than 50%). Vertebral arteries: No evidence of dissection, occlusion, or hemodynamically significant stenosis (greater than 50%). Skeleton: No acute osseous abnormality. Degenerative changes in the cervical spine. Other neck: Hypoenhancing nodule in the right thyroid lobe  measures up to 9 mm, for which no follow-up is currently indicated. (Reference: J Am Coll Radiol. 2015 Feb;12(2): 143-50) Upper chest: No focal pulmonary opacity or pleural effusion. Review of the MIP images confirms the above findings CTA HEAD FINDINGS Anterior circulation: Both internal carotid arteries are patent to the termini, with moderate stenosis in the right cavernous  segment and severe stenosis in distal left cavernous and proximal left supraclinoid ICA. Patent right A1. With severely hypoplastic left A1. Multifocal moderate stenosis in the right A2 and A3 (series 7, images 83-84 and 94). No significant stenosis in the left ACA. Severe stenosis in the proximal right M1 and moderate stenosis in the distal right M1 (series 7, image 109). No significant stenosis in the left M1. Multifocal mild stenosis in the right MCA branches (series 7, image 115, for example). No significant stenosis in the left MCA branches. Stenosis. Posterior circulation: Vertebral arteries patent to the vertebrobasilar junction without significant stenosis. Posterior inferior cerebellar arteries patent proximally. Basilar patent to its distal aspect without significant stenosis. Superior cerebellar arteries patent proximally. Patent P1 segments. Severe stenosis in the proximal left P2 (series 7, image 115), mid P2 (series 7, image 120), and slightly more distal P2 (series 7, image 122), with poor flow in the left P3 segments. Mild stenosis in the proximal right P 2 (series 7, image 121) and mid right P1 (series 7, image 124). No other significant stenosis in the right PCA. The left posterior communicating artery is diminutive but patent. Venous sinuses: As permitted by contrast timing, patent. Anatomic variants: None significant. Review of the MIP images confirms the above findings IMPRESSION: 1. Severe, near occlusive stenosis in the proximal and mid left P2, with poor flow in the left P3 segments. 2. Severe stenosis in the distal left cavernous and proximal left supraclinoid ICA. 3. Severe stenosis in the proximal right M1 and moderate stenosis in the distal right M1. Multifocal mild stenosis in the right MCA branches. 4. Mild stenosis in the proximal right P1 and proximal right P2. 5. No hemodynamically significant stenosis in the neck. 6. Aortic atherosclerosis. Aortic Atherosclerosis (ICD10-I70.0).  Imaging results were communicated on 07/25/2022 at 4:07 am to provider BHAGAT via secure text paging. Electronically Signed   By: Wiliam Ke M.D.   On: 07/25/2022 04:08   DG Chest Portable 1 View  Result Date: 07/25/2022 CLINICAL DATA:  Difficulty breathing EXAM: PORTABLE CHEST 1 VIEW COMPARISON:  01/30/2017 FINDINGS: Cardiomegaly. Mediastinal contours within normal limits. Aortic atherosclerosis. No confluent airspace opacities, effusions or edema. IMPRESSION: Mild cardiomegaly.  No active disease. Electronically Signed   By: Charlett Nose M.D.   On: 07/25/2022 01:10   CT HEAD CODE STROKE WO CONTRAST  Result Date: 07/25/2022 CLINICAL DATA:  Code stroke. EXAM: CT HEAD WITHOUT CONTRAST TECHNIQUE: Contiguous axial images were obtained from the base of the skull through the vertex without intravenous contrast. RADIATION DOSE REDUCTION: This exam was performed according to the departmental dose-optimization program which includes automated exposure control, adjustment of the mA and/or kV according to patient size and/or use of iterative reconstruction technique. COMPARISON:  No prior CT head available, correlation is made with MRI head 07/23/2022 FINDINGS: Brain: No evidence of acute infarction, hemorrhage, mass, mass effect, or midline shift. No hydrocephalus or extra-axial collection. Periventricular white matter changes, likely the sequela of chronic small vessel ischemic disease. Vascular: No hyperdense vessel. Atherosclerotic calcifications in the intracranial carotid and vertebral arteries. Skull: Negative for fracture or focal lesion. Sinuses/Orbits: No acute finding. Other:  The mastoid air cells are well aerated. ASPECTS [facility] Stroke Program Early CT Score) - Ganglionic level infarction (caudate, lentiform nuclei, internal capsule, insula, M1-M3 cortex): 7 - Supraganglionic infarction (M4-M6 cortex): 3 Total score (0-10 with 10 being normal): 10 IMPRESSION: 1. No acute intracranial process. 2. ASPECTS  is 10. Imaging results were communicated on 07/25/2022 at 12:05 am to provider BHAGAT via secure text paging. Electronically Signed   By: Wiliam Ke M.D.   On: 07/25/2022 00:05   ECHOCARDIOGRAM COMPLETE  Result Date: 08/03/2022    ECHOCARDIOGRAM REPORT   Patient Name:   Devon Davis Date of Exam: 07/22/2022 Medical Rec #:  [id number]          Height:       69.0 in Accession #:    [id number]         Weight:       193.8 lb Date of Birth:  19-Dec-1921         BSA:          2.038 m Patient Age:    89 years           BP:           169/75 mmHg Patient Gender: M                  HR:           79 bpm. [facility] Procedure: 2D Echo, Cardiac Doppler, Color Doppler and Intracardiac            Opacification Agent Indications:   Stroke  History:       Patient has no prior history of Echocardiogram examinations.                Stroke.  Sonographer:   Milda Smart Referring      [id number] Onnie Boer Phys:  Sonographer Comments: Technically difficult study due to poor echo windows. Image acquisition challenging due to patient body habitus and Image acquisition challenging due to respiratory motion. IMPRESSIONS  1. Left ventricular ejection fraction, by estimation, is 55 to 60%. The left ventricle has normal function. The left ventricle has no regional wall motion abnormalities. Left ventricular diastolic parameters are consistent with Grade I diastolic dysfunction (impaired relaxation).  2. Right ventricular systolic function was not well visualized. The right ventricular size is normal. There is normal pulmonary artery systolic pressure.  3. The mitral valve is normal in structure. No evidence of mitral valve regurgitation. No evidence of mitral stenosis.  4. The aortic valve was not well visualized. There is mild calcification of the aortic valve. Aortic valve regurgitation is mild to moderate. Mild aortic valve stenosis. Aortic valve mean gradient measures 12.0 mmHg. Aortic valve Vmax measures 2.24  m/s.  5. The inferior vena cava is normal in size with greater than 50% respiratory variability, suggesting right atrial pressure of 3 mmHg. Comparison(s): No prior Echocardiogram. FINDINGS  Left Ventricle: Left ventricular ejection fraction, by estimation, is 55 to 60%. The left ventricle has normal function. The left ventricle has no regional wall motion abnormalities. Definity contrast agent was given IV to delineate the left ventricular  endocardial borders. The left ventricular internal cavity size was normal in size. There is no left ventricular hypertrophy. Left ventricular diastolic parameters are consistent with Grade I diastolic dysfunction (impaired relaxation). Right Ventricle: The right ventricular size is normal. No increase in right ventricular wall thickness. Right ventricular systolic function was not well visualized. There is normal  pulmonary artery systolic pressure. The tricuspid regurgitant velocity is  1.99 m/s, and with an assumed right atrial pressure of 3 mmHg, the estimated right ventricular systolic pressure is 18.8 mmHg. Left Atrium: Left atrial size was normal in size. Right Atrium: Right atrial size was normal in size. Pericardium: There is no evidence of pericardial effusion. Mitral Valve: The mitral valve is normal in structure. No evidence of mitral valve regurgitation. No evidence of mitral valve stenosis. Tricuspid Valve: The tricuspid valve is normal in structure. Tricuspid valve regurgitation is trivial. No evidence of tricuspid stenosis. Aortic Valve: The aortic valve was not well visualized. There is mild calcification of the aortic valve. Aortic valve regurgitation is mild to moderate. Mild aortic stenosis is present. Aortic valve mean gradient measures 12.0 mmHg. Aortic valve peak gradient measures 20.1 mmHg. Aortic valve area, by VTI measures 1.27 cm. Pulmonic Valve: The pulmonic valve was not well visualized. Pulmonic valve regurgitation is not visualized. No evidence of  pulmonic stenosis. Aorta: The aortic root and ascending aorta are structurally normal, with no evidence of dilitation. Venous: The inferior vena cava is normal in size with greater than 50% respiratory variability, suggesting right atrial pressure of 3 mmHg. IAS/Shunts: The interatrial septum was not well visualized.  LEFT VENTRICLE PLAX 2D LVIDd:         5.30 cm   Diastology LVIDs:         3.80 cm   LV e' medial:    2.82 cm/s LV PW:         1.10 cm   LV E/e' medial:  12.3 LV IVS:        0.90 cm   LV e' lateral:   3.57 cm/s LVOT diam:     2.00 cm   LV E/e' lateral: 9.7 LV SV:         52 LV SV Index:   25 LVOT Area:     3.14 cm  RIGHT VENTRICLE RV S prime:     8.52 cm/s TAPSE (M-mode): 2.3 cm LEFT ATRIUM             Index        RIGHT ATRIUM           Index LA diam:        4.20 cm 2.06 cm/m   RA Area:     12.80 cm LA Vol (A2C):   53.4 ml 26.20 ml/m  RA Volume:   27.20 ml  13.34 ml/m LA Vol (A4C):   47.5 ml 23.30 ml/m LA Biplane Vol: 50.5 ml 24.77 ml/m  AORTIC VALVE AV Area (Vmax):    1.25 cm AV Area (Vmean):   1.19 cm AV Area (VTI):     1.27 cm AV Vmax:           224.00 cm/s AV Vmean:          167.000 cm/s AV VTI:            0.409 m AV Peak Grad:      20.1 mmHg AV Mean Grad:      12.0 mmHg LVOT Vmax:         88.80 cm/s LVOT Vmean:        63.100 cm/s LVOT VTI:          0.165 m LVOT/AV VTI ratio: 0.40  AORTA Ao Root diam: 3.40 cm Ao Asc diam:  3.40 cm MITRAL VALVE  TRICUSPID VALVE MV Area (PHT): 3.13 cm     TR Peak grad:   15.8 mmHg MV Decel Time: 242 msec     TR Vmax:        199.00 cm/s MV E velocity: 34.70 cm/s MV A velocity: 103.00 cm/s  SHUNTS MV E/A ratio:  0.34         Systemic VTI:  0.16 m                             Systemic Diam: 2.00 cm Vishnu Priya Mallipeddi Electronically signed by Winfield Rast Mallipeddi Signature Date/Time: 07/27/2022/2:54:40 PM    Final    US Carotid Bilateral (at [facility] and [facility] only)  Result Date: 07/31/2022 CLINICAL DATA:  CVA.  Transient left lower extremity  weakness. EXAM: BILATERAL CAROTID DUPLEX ULTRASOUND TECHNIQUE: Wallace Cullens scale imaging, color Doppler and duplex ultrasound were performed of bilateral carotid and vertebral arteries in the neck. COMPARISON:  None Available. FINDINGS: Criteria: Quantification of carotid stenosis is based on velocity parameters that correlate the residual internal carotid diameter with NASCET-based stenosis levels, using the diameter of the distal internal carotid lumen as the denominator for stenosis measurement. The following velocity measurements were obtained: RIGHT ICA: 68/22 cm/sec CCA: 68/7 cm/sec SYSTOLIC ICA/CCA RATIO:  1.0 ECA: 102 cm/sec LEFT ICA: 40/7 cm/sec CCA: 74/7 cm/sec SYSTOLIC ICA/CCA RATIO:  0.5 ECA: 132 cm/sec RIGHT CAROTID ARTERY: There is no grayscale evidence of significant intimal thickening or atherosclerotic plaque affecting the interrogated portions of the right carotid system. There are no elevated peak systolic velocities within the interrogated course of the right internal carotid artery to suggest a hemodynamically significant stenosis. RIGHT VERTEBRAL ARTERY:  Antegrade flow LEFT CAROTID ARTERY: There is a minimal amount of eccentric echogenic plaque within left carotid bulb (images 48 and 50), extending to involve the origin and proximal aspects of the left internal carotid artery (image 58), not resulting in elevated peak systolic velocities within the interrogated course of the left internal carotid artery to suggest a hemodynamically significant stenosis. LEFT VERTEBRAL ARTERY:  Antegrade flow IMPRESSION: 1. Minimal amount of left-sided atherosclerotic plaque, not resulting in a hemodynamically significant stenosis. 2. Unremarkable sonographic evaluation of the right carotid system. Electronically Signed   By: Simonne Come M.D.   On: 08/10/2022 14:01   MR BRAIN WO CONTRAST  Result Date: 07/23/2022 CLINICAL DATA:  TIA EXAM: MRI HEAD WITHOUT CONTRAST MRA HEAD WITHOUT CONTRAST TECHNIQUE: Multiplanar,  multi-echo pulse sequences of the brain and surrounding structures were acquired without intravenous contrast. Angiographic images of the Circle of Willis were acquired using MRA technique without intravenous contrast. COMPARISON:  None Available. FINDINGS: MRI HEAD FINDINGS Brain: There is a small focus of diffusion restriction in the right lentiform nucleus with associated FLAIR signal abnormality consistent with acute to early subacute infarct. There is no hemorrhage or mass effect. There is additional curvilinear diffusion restriction with FLAIR signal abnormality along the undersurface of the left occipital lobe consistent with additional infarct which appears subacute in chronicity. There is no acute intracranial hemorrhage or extra-axial fluid collection. Parenchymal volume is within expected limits for age. The ventricles are normal in size. Patchy FLAIR signal abnormality throughout the remainder of the supratentorial white matter likely reflects sequela of moderate chronic small-vessel ischemic change. The pituitary and suprasellar region are normal. There is no mass lesion. There is no mass effect or midline shift. Vascular: The major flow voids are normal. The vasculature is assessed  in full below. Skull and upper cervical spine: Normal marrow signal. Sinuses/Orbits: The paranasal sinuses are clear. Bilateral lens implants are in place. The globes and orbits are otherwise unremarkable. Other: None. MRA HEAD FINDINGS Anterior circulation: The intracranial ICAs are patent with atherosclerotic irregularity resulting in mild-to-moderate stenosis, left worse than right. The bilateral MCAs are patent, without proximal high-grade stenosis or occlusion. The left A1 segment is occluded. The right A1 segment is normal. The anterior communicating artery is normal. The distal ACA branches appear patent but with multifocal high-grade stenoses of the right A2 and A3 segments. There is no aneurysm or AVM. Posterior  circulation: The V4 segments are not included within the field of view. The basilar artery is patent. There is multifocal high-grade stenosis/occlusion of the left P2 segment with no flow related enhancement in the distal PCA branches. The right P1 segment is patent there is occlusion of a superior right P2/P3 segment after a bifurcation. There is no aneurysm or AVM. Anatomic variants: None. IMPRESSION: 1. Small acute to early subacute infarct in the right lentiform nucleus, and additional subacute appearing cortical infarct along the undersurface of the left occipital lobe. 2. Occluded left A1 segment and multifocal high-grade stenosis of the right A2 and A3 segments. 3. Multifocal high-grade stenosis/occlusion of the left P2 segment with no flow related enhancement in the distal PCA branches. 4. Occlusion of the superior right P2/P3 segment. Electronically Signed   By: Lesia Hausen M.D.   On: 07/23/2022 14:31   MR ANGIO HEAD WO CONTRAST  Result Date: 07/23/2022 CLINICAL DATA:  TIA EXAM: MRI HEAD WITHOUT CONTRAST MRA HEAD WITHOUT CONTRAST TECHNIQUE: Multiplanar, multi-echo pulse sequences of the brain and surrounding structures were acquired without intravenous contrast. Angiographic images of the Circle of Willis were acquired using MRA technique without intravenous contrast. COMPARISON:  None Available. FINDINGS: MRI HEAD FINDINGS Brain: There is a small focus of diffusion restriction in the right lentiform nucleus with associated FLAIR signal abnormality consistent with acute to early subacute infarct. There is no hemorrhage or mass effect. There is additional curvilinear diffusion restriction with FLAIR signal abnormality along the undersurface of the left occipital lobe consistent with additional infarct which appears subacute in chronicity. There is no acute intracranial hemorrhage or extra-axial fluid collection. Parenchymal volume is within expected limits for age. The ventricles are normal in size.  Patchy FLAIR signal abnormality throughout the remainder of the supratentorial white matter likely reflects sequela of moderate chronic small-vessel ischemic change. The pituitary and suprasellar region are normal. There is no mass lesion. There is no mass effect or midline shift. Vascular: The major flow voids are normal. The vasculature is assessed in full below. Skull and upper cervical spine: Normal marrow signal. Sinuses/Orbits: The paranasal sinuses are clear. Bilateral lens implants are in place. The globes and orbits are otherwise unremarkable. Other: None. MRA HEAD FINDINGS Anterior circulation: The intracranial ICAs are patent with atherosclerotic irregularity resulting in mild-to-moderate stenosis, left worse than right. The bilateral MCAs are patent, without proximal high-grade stenosis or occlusion. The left A1 segment is occluded. The right A1 segment is normal. The anterior communicating artery is normal. The distal ACA branches appear patent but with multifocal high-grade stenoses of the right A2 and A3 segments. There is no aneurysm or AVM. Posterior circulation: The V4 segments are not included within the field of view. The basilar artery is patent. There is multifocal high-grade stenosis/occlusion of the left P2 segment with no flow related enhancement in the distal PCA branches.  The right P1 segment is patent there is occlusion of a superior right P2/P3 segment after a bifurcation. There is no aneurysm or AVM. Anatomic variants: None. IMPRESSION: 1. Small acute to early subacute infarct in the right lentiform nucleus, and additional subacute appearing cortical infarct along the undersurface of the left occipital lobe. 2. Occluded left A1 segment and multifocal high-grade stenosis of the right A2 and A3 segments. 3. Multifocal high-grade stenosis/occlusion of the left P2 segment with no flow related enhancement in the distal PCA branches. 4. Occlusion of the superior right P2/P3 segment.  Electronically Signed   By: Lesia Hausen M.D.   On: 07/23/2022 14:31    Pending Labs Unresulted Labs (From admission, onward)     Start     Ordered   08/01/22 0500  Creatinine, serum  (enoxaparin (LOVENOX)    CrCl >/= 30 ml/min)  Weekly,   R     Comments: while on enoxaparin therapy    07/25/22 0403   07/25/22 0500  Lipid panel  (Labs)  Tomorrow morning,   R       Comments: Fasting    07/25/22 0403   07/25/22 0020  Blood gas, venous (at [facility] and [facility])  Once,   R        07/25/22 0019            Vitals/Pain Today's Vitals   07/25/22 0906 07/25/22 0910 07/25/22 1004 07/25/22 1004  BP:   111/79   Pulse:  100 (!) 107   Resp:  (!) 22 20   Temp: (!) 97.4 F (36.3 C)     TempSrc: Axillary     SpO2:  97% 97%   Weight:      Height:      PainSc:    0-No pain    Isolation Precautions No active isolations  Medications Medications  ticagrelor (BRILINTA) tablet 90 mg (has no administration in time range)  aspirin chewable tablet 81 mg (has no administration in time range)   stroke: early stages of recovery book (has no administration in time range)  acetaminophen (TYLENOL) tablet 650 mg (has no administration in time range)    Or  acetaminophen (TYLENOL) 160 MG/5ML solution 650 mg (has no administration in time range)    Or  acetaminophen (TYLENOL) suppository 650 mg (has no administration in time range)  senna-docusate (Senokot-S) tablet 1 tablet (has no administration in time range)  enoxaparin (LOVENOX) injection 40 mg (40 mg Subcutaneous Given 07/25/22 1033)  sodium chloride 0.9 % bolus 500 mL (0 mLs Intravenous Stopped 07/25/22 0502)  iohexol (OMNIPAQUE) 350 MG/ML injection 75 mL (75 mLs Intravenous Contrast Given 07/25/22 0347)    Mobility walks with device     Focused Assessments Neuro Assessment Handoff:  Swallow screen pass? No   NIH Stroke Scale  Dizziness Present: No Headache Present: No Interval: Initial Level of Consciousness (1a.)   : Alert, keenly  responsive LOC Questions (1b. )   : Answers one question correctly LOC Commands (1c. )   : Performs both tasks correctly Best Gaze (2. )  : Normal Visual (3. )  : No visual loss Facial Palsy (4. )    : Minor paralysis Motor Arm, Left (5a. )   : Drift Motor Arm, Right (5b. ) : No drift Motor Leg, Left (6a. )  : Some effort against gravity Motor Leg, Right (6b. ) : Drift Limb Ataxia (7. ): Present in two limbs Sensory (8. )  : Mild-to-moderate sensory loss, patient  feels pinprick is less sharp or is dull on the affected side, or there is a loss of superficial pain with pinprick, but patient is aware of being touched Best Language (9. )  : No aphasia Dysarthria (10. ): Mild-to-moderate dysarthria, patient slurs at least some words and, at worst, can be understood with some difficulty Extinction/Inattention (11.)   : No Abnormality Complete NIHSS TOTAL: 10 Last date known well: 08-01-22 Last time known well: 1800 Neuro Assessment:   Neuro Checks:   Initial (07/25/22 0003)  Has TPA been given? No If patient is a Neuro Trauma and patient is going to OR before floor call report to 4N Charge nurse: [phone] or [phone]   R Recommendations: See Admitting Provider Note  Report given to:   Additional Notes:

## 2022-07-25 NOTE — Progress Notes (Signed)
PT Cancellation Note  Patient Details Name: SEIYA BORKE MRN: [id number] DOB: 1921-09-13   Cancelled Treatment:    Reason Eval/Treat Not Completed: Other (comment).  Pt was with another staff member, reattempt at another time.   Ivar Drape 07/25/2022, 3:01 PM  Samul Dada, PT PhD Acute Rehab Dept. Number: [facility] [id number] and [facility] [phone]

## 2022-07-25 NOTE — ED Notes (Signed)
Pt transported back from MRI

## 2022-07-25 NOTE — Evaluation (Signed)
Clinical/Bedside Swallow Evaluation Patient Details  Name: Devon Davis MRN: [id number] Date of Birth: 04-15-1921  Today's Date: 07/25/2022 Time: SLP Start Time (ACUTE ONLY): 1423 SLP Stop Time (ACUTE ONLY): 1440 SLP Time Calculation (min) (ACUTE ONLY): 17 min  Past Medical History:  Past Medical History:  Diagnosis Date   Arthritis    Bladder stone    GERD (gastroesophageal reflux disease)    History of kidney stones    HOH (hard of hearing)    Stroke (HCC) 2008   Past Surgical History:  Past Surgical History:  Procedure Laterality Date   BACK SURGERY     Dr Renaye Rakers   CYSTOSCOPY WITH LITHOLAPAXY N/A 07/26/2020   Procedure: CYSTOSCOPY WITH LITHOLAPAXY AND FULGERATION;  Surgeon: Bjorn Pippin, MD;  Location: [facility];  Service: Urology;  Laterality: N/A;   LITHOTRIPSY     PROSTATE BIOPSY N/A 07/26/2020   Procedure: BIOPSY TRANSRECTAL ULTRASONIC PROSTATE (TUBP);  Surgeon: Bjorn Pippin, MD;  Location: [facility];  Service: Urology;  Laterality: N/A;   HPI:  Pt is a 89 y.o. male with medical history significant for stroke, kidney stone. Pt lives at home and has a caretaker who is his long-term friend there 24/7. Pt's caregiver reports some cognitive impairment (primarily memory deficit) at baseline. Patient presented to the ED with complaints of dizziness and weakness on 5/2 and was admitted at [facility] with acute CVA. Patient discharged home on DAPT.  On arriving home patient was fine.  Caregiver left.  When he returned at 10 PM patient was dysarthric, unable to stand, facial droop, left-sided weakness.  He was returned to the ER this AM, where he subsequently failed his [employer]. Pt reported by nursing staff to be severely dysarthric. Pt had previous SLP evaluation where the pt presented with a mild dysarthria with speech intelligibility slightly reduced (75-100% intelligible) and cognitive impairment noted with memory and thought organization may be baseline or slightly worse than  baseline. No s/sx of aspiration with previous hospitalization. Speech language evaluation and BSE requested at this time.    Assessment / Plan / Recommendation  Clinical Impression   Pt seen for skilled ST evaluation to determine PO readiness, evaluated in the ED with two friends (one of which is a caretaker) present. Pt is currently NPO following a failed [employer] Screen early this AM, prior to this admission the pt was on a regular/thin liquid diet. Pt was assessed with thin liquid, mildly thick liquid, and puree. Pts OME revealed a left side weakness with reduced labial ROM and labial seal. The pts prompted cough was strong and productive, pts cued swallow had mod-severe reduced hyolaryngeal excursion. The pt also had poor oral hygiene and moderate xerostomia. Pt required max assist with all PO trials due to physical deficits. Pt consumed ice chips x1 and had slightly wet vocal quality. Pt had x3 trials via spoon of thin liquid, with x3 immediate wet and gurgly vocal quality. Pt had x2 mildly thick liquids via spoon, with x2 immediate wet and gugrly vocal quality. Pt had moderate success with a prompted throat clearance and dry swallow. Pt then consumed x2 spoonfuls of puree and had x2 immediate wet and gurgly vocal quality. Given an approx. 3 minute delay, the pt had a wet cough. Given the pts results with BSE, safest diet recs at this time remain NPO with ice chips PRN following STRICT oral care administered by nursing staff only. Results of BSE explained to the pt and the pt's two visitors,  the pt's caretaker verbally acknowledged understanding. SLP to follow up with skilled intervention and objective assessment as available.  SLP Visit Diagnosis: Dysarthria and anarthria (R47.1);Cognitive communication deficit (R41.841);Dysphagia, unspecified (R13.10)    Aspiration Risk  Severe aspiration risk;Risk for inadequate nutrition/hydration    Diet Recommendation NPO;Ice chips PRN after oral care (ice  chips administered by nursing staff only)   Medication Administration: Via alternative means    Other  Recommendations      Recommendations for follow up therapy are one component of a multi-disciplinary discharge planning process, led by the attending physician.  Recommendations may be updated based on patient status, additional functional criteria and insurance authorization.     Assistance Recommended at Discharge    Functional Status Assessment Patient has had a recent decline in their functional status and/or demonstrates limited ability to make significant improvements in function in a reasonable and predictable amount of time  Frequency and Duration min 2x/week  1 week       Prognosis Prognosis for improved oropharyngeal function: Guarded (further objective evaluation required to better determine prognosis) Barriers to Reach Goals: Cognitive deficits;Severity of deficits      Swallow Study   General Date of Onset: 07/25/22 HPI: Pt is a 89 y.o. male with medical history significant for stroke, kidney stone. Pt lives at home and has a caretaker who is his long-term friend there 24/7. Pt's caregiver reports some cognitive impairment (primarily memory deficit) at baseline. Patient presented to the ED with complaints of dizziness and weakness on 5/2 and was admitted at [facility] with acute CVA. Patient discharged home on DAPT.  On arriving home patient was fine.  Caregiver left.  When he returned at 10 PM patient was dysarthric, unable to stand, facial droop, left-sided weakness.  He was returned to the ER this AM, where he subsequently failed his [employer]. Pt reported by nursing staff to be severely dysarthric. Pt had previous SLP evaluation where the pt presented with a mild dysarthria with speech intelligibility slightly reduced (75-100% intelligible) and cognitive impairment noted with memory and thought organization may be baseline or slightly worse than baseline. No s/sx of  aspiration with previous hospitalization. Speech language evaluation and BSE requested at this time. Type of Study: Bedside Swallow Evaluation Previous Swallow Assessment: n/a Diet Prior to this Study: Regular;Thin liquids (Level 0) Respiratory Status: Room air History of Recent Intubation: No Behavior/Cognition: Cooperative;Pleasant mood;Confused Oral Cavity Assessment: Dry Oral Care Completed by SLP: No Oral Cavity - Dentition: Poor condition;Missing dentition Vision:  (not tested) Self-Feeding Abilities: Needs assist Patient Positioning: Upright in bed Baseline Vocal Quality: Wet Volitional Cough: Strong Volitional Swallow: Able to elicit    Oral/Motor/Sensory Function Overall Oral Motor/Sensory Function: Moderate impairment Facial ROM: Reduced left Facial Symmetry: Abnormal symmetry left Facial Strength: Reduced left Lingual ROM: Within Functional Limits Lingual Symmetry: Within Functional Limits Lingual Strength: Within Functional Limits Lingual Sensation: Within Functional Limits   Ice Chips Ice chips: Impaired Presentation: Spoon Pharyngeal Phase Impairments: Wet Vocal Quality;Decreased hyoid-laryngeal movement Other Comments: Slightly wet vocal quality with x1 trial   Thin Liquid Thin Liquid: Impaired Presentation: Spoon Oral Phase Impairments: Reduced labial seal Pharyngeal  Phase Impairments: Wet Vocal Quality;Cough - Delayed;Decreased hyoid-laryngeal movement;Suspected delayed Swallow Other Comments: x2 sips of water via spoon with assist, pt immediately had severe wet and gurgly vocal quality    Nectar Thick Nectar Thick Liquid: Impaired Presentation: Spoon Pharyngeal Phase Impairments: Suspected delayed Swallow;Throat Clearing - Delayed;Wet Vocal Quality;Decreased hyoid-laryngeal movement;Cough - Delayed Other  Comments: x3 sips of thickened juice via spoon with assist, pt immediately had severe wet and gurgly vocal quality (x3 sips of water via spoon with assist, pt  immediately had severe wet and gurgly vocal quality)   Honey Thick     Puree Puree: Impaired Presentation: Spoon Oral Phase Impairments: Poor awareness of bolus Pharyngeal Phase Impairments: Wet Vocal Quality;Cough - Delayed;Throat Clearing - Delayed;Decreased hyoid-laryngeal movement Other Comments: x2 spoonfuls with assist, pt immediately had severe wet and gurgly vocal quality   Solid     Solid: Not tested Other Comments: due to s/s of aspiration with prior consistencies, major concern for choking      Dione Housekeeper M.S. CF-SLP

## 2022-07-25 NOTE — ED Notes (Signed)
Thew pt choked on the water for the swallow screen

## 2022-07-26 DIAGNOSIS — I6330 Cerebral infarction due to thrombosis of unspecified cerebral artery: Secondary | ICD-10-CM | POA: Diagnosis not present

## 2022-07-26 MED ORDER — ASPIRIN 300 MG RE SUPP
300.0000 mg | Freq: Every day | RECTAL | Status: DC
  Administered 2022-07-26 – 2022-07-27 (×2): 300 mg via RECTAL
  Filled 2022-07-26 (×2): qty 1

## 2022-07-26 NOTE — Evaluation (Signed)
Occupational Therapy Evaluation Patient Details Name: Devon Davis MRN: [id number] DOB: 1921-07-02 Today's Date: 07/26/2022   History of Present Illness Devon Davis is a 88 y.o. male who presented after his caregiver noted dysarthria, ambulatory difficulty, facial droop, left-sided weakness on 07/23/2022. Pt found to have mild extension of R corona radiata/lentiform infarct. Pt admitted to [facility] 07/23/22-early 08/07/2022 with subacute R lentiform and L occipital stroke with symptoms resolving and returned home. PMH significant for prior strokes without significant residual deficit; history of DM2, HTN, HLD currently on DAPT, metastatic prostate cancer   Clinical Impression   Devon Davis was evaluated s/p the above admission list. Per his PCG he is mod I at baseline and does not have/need 24/7 care. Upon evaluation he was limited by L sensory motor deficits, L inattention, impaired cognition and communication, L lateral lean, poor balance and decreased activity tolerance. Overall he needed max A +2 fro bed mobility and min-max A for sitting balance. He stood with mod A +2 and HHA Due to the deficits listed below he also needs up to total A for LB ADLs and max A for UB ADLs. Pt will benefit from continued acute OT services and skilled inpatient follow up therapy, <3 hours/day.       Recommendations for follow up therapy are one component of a multi-disciplinary discharge planning process, led by the attending physician.  Recommendations may be updated based on patient status, additional functional criteria and insurance authorization.   Assistance Recommended at Discharge Frequent or constant Supervision/Assistance  Patient can return home with the following A lot of help with walking and/or transfers;A lot of help with bathing/dressing/bathroom;Two people to help with bathing/dressing/bathroom;Assistance with cooking/housework;Assistance with feeding;Direct supervision/assist for medications  management;Direct supervision/assist for financial management;Assist for transportation;Help with stairs or ramp for entrance    Functional Status Assessment  Patient has had a recent decline in their functional status and demonstrates the ability to make significant improvements in function in a reasonable and predictable amount of time.  Equipment Recommendations  Other (comment) (defer)       Precautions / Restrictions Precautions Precautions: Fall Restrictions Weight Bearing Restrictions: No      Mobility Bed Mobility Overal bed mobility: Needs Assistance Bed Mobility: Supine to Sit, Sit to Supine     Supine to sit: +2 for physical assistance, Max assist Sit to supine: +2 for physical assistance, Max assist   General bed mobility comments: Max of 2 to transition to sitting with posterior lean and legs blocked to sit.    Transfers Overall transfer level: Needs assistance Equipment used: 2 person hand held assist Transfers: Sit to/from Stand Sit to Stand: Mod assist, +2 physical assistance           General transfer comment: Mod x 2 to rise with use of belt and pad to assist.  Pt did assist with leaning forward and pushing to stand.  Cues to stand straight and tuck bottom. Not safe to leave in chair (confused, sliding down in bed) so did not pivot.      Balance Overall balance assessment: Needs assistance Sitting-balance support: Single extremity supported, Feet supported Sitting balance-Leahy Scale: Poor Sitting balance - Comments: Initially requiring mod/max A with posterior lean and pushing to L.  Had pt lean onto R elbow and then transitioned back up - improved balance/less pushing.  Requiring min A at EOB for at least 8 mins   Standing balance support: Bilateral upper extremity supported Standing balance-Leahy Scale: Poor Standing balance comment:  mod A of 2 to rise and pt's trunk leaning forward; facilitated posture with knee blocked, support at bottom, and  trying to lift shoulders; standing <30 sec                           ADL either performed or assessed with clinical judgement   ADL Overall ADL's : Needs assistance/impaired Eating/Feeding: NPO   Grooming: Maximal assistance;Bed level   Upper Body Bathing: Maximal assistance;Sitting   Lower Body Bathing: Total assistance;+2 for physical assistance;+2 for safety/equipment;Sit to/from stand   Upper Body Dressing : Maximal assistance;Sitting   Lower Body Dressing: Total assistance;+2 for safety/equipment;+2 for physical assistance;Sit to/from stand   Toilet Transfer: Maximal assistance;+2 for physical assistance;+2 for safety/equipment   Toileting- Clothing Manipulation and Hygiene: Total assistance;+2 for physical assistance;+2 for safety/equipment;Sit to/from stand       Functional mobility during ADLs: Maximal assistance;+2 for physical assistance;+2 for safety/equipment General ADL Comments: limited by new L weakness, inattention and impiared cognition and communicaiton     Vision Baseline Vision/History: 1 Wears glasses Ability to See in Adequate Light: 1 Impaired Vision Assessment?: Vision impaired- to be further tested in functional context Additional Comments: difficult to fully assess - L inattention. requires maximal cue to cross midline to the L     Perception Perception Perception Tested?: No   Praxis Praxis Praxis tested?: Not tested    Pertinent Vitals/Pain Pain Assessment Pain Assessment: Faces Faces Pain Scale: No hurt Pain Intervention(s): Monitored during session     Hand Dominance Right   Extremity/Trunk Assessment Upper Extremity Assessment Upper Extremity Assessment: RUE deficits/detail;LUE deficits/detail RUE Deficits / Details: Globally weak, difficult to fully assess LUE Deficits / Details: at least 3/5 throughout, difficutl to fully assess due to impaired cognition LUE Sensation: decreased proprioception LUE Coordination:  decreased fine motor;decreased gross motor   Lower Extremity Assessment Lower Extremity Assessment: Defer to PT evaluation RLE Deficits / Details: ROM WFL; appears to have at least 3/5 strength but not following further MMT commands LLE Deficits / Details: ROM WFL; MMT: Does appear weaker than R, not following MMT commands, at least 1/5 throughout   Cervical / Trunk Assessment Cervical / Trunk Assessment: Kyphotic   Communication Communication Communication: HOH   Cognition Arousal/Alertness: Awake/alert Behavior During Therapy: WFL for tasks assessed/performed Overall Cognitive Status: Difficult to assess                                 General Comments: Caregiver reports pt typically manages his own meds.  Today, pt with R gaze preference but could attend to L with cues.  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 Hearing aide on L.  Difficult to fully assess cognition due to dysarthria.  Pt following some simple commands but requiring multimodal cues.  Did note pt received Haldol yesterday evening due to combativeness.  He was cooperative today.     General Comments  VSS on RA, pts PCA arrived at the end of the session     Home Living Family/patient expects to be discharged to:: Private residence Living Arrangements: Alone Available Help at Discharge: Personal care attendant;Available PRN/intermittently Type of Home: House Home Access: Level entry     Home Layout: One level     Bathroom Shower/Tub: Producer, television/film/video: Standard Bathroom Accessibility: Yes   Home Equipment: BSC/3in1;Tub bench;Rollator (4 wheels)  Additional Comments: Pt unable to provide hx.  Obtained from prior admission and caregiver, Devon Davis  Lives With: Other (Comment)    Prior Functioning/Environment Prior Level of Function : Needs assist       Physical Assist : Mobility (physical);ADLs (physical) Mobility (physical): Bed  mobility;Transfers;Gait;Stairs ADLs (physical): IADLs Mobility Comments: Caregiver reports that up until this event he was independent with rollator and would walk up/down driveway.  States last fall was 2 years ago ADLs Comments: Independent with ADL's. Assisted by care attendants for IADL's.        OT Problem List: Decreased strength;Decreased activity tolerance;Impaired balance (sitting and/or standing);Decreased safety awareness;Decreased coordination      OT Treatment/Interventions: Self-care/ADL training;Therapeutic exercise;Therapeutic activities;Patient/family education;Balance training;Neuromuscular education;DME and/or AE instruction    OT Goals(Current goals can be found in the care plan section) Acute Rehab OT Goals Patient Stated Goal: per friend, to get better OT Goal Formulation: Patient unable to participate in goal setting Time For Goal Achievement: 08/09/22 Potential to Achieve Goals: Good ADL Goals Pt Will Perform Grooming: with min assist;sitting Pt Will Perform Upper Body Dressing: with min assist;sitting Pt Will Perform Lower Body Dressing: with max assist;sit to/from stand Pt Will Transfer to Toilet: with max assist;stand pivot transfer;bedside commode Additional ADL Goal #1: Pt will complete bed mobility with min A as a precursor to ADLs  OT Frequency: Min 2X/week    Co-evaluation PT/OT/SLP Co-Evaluation/Treatment: Yes Reason for Co-Treatment: Complexity of the patient's impairments (multi-system involvement);For patient/therapist safety;To address functional/ADL transfers;Necessary to address cognition/behavior during functional activity PT goals addressed during session: Mobility/safety with mobility;Balance OT goals addressed during session: ADL's and self-care      AM-PAC OT "6 Clicks" Daily Activity     Outcome Measure Help from another person eating meals?: Total Help from another person taking care of personal grooming?: A Lot Help from another  person toileting, which includes using toliet, bedpan, or urinal?: Total Help from another person bathing (including washing, rinsing, drying)?: A Lot Help from another person to put on and taking off regular upper body clothing?: A Lot Help from another person to put on and taking off regular lower body clothing?: Total 6 Click Score: 9   End of Session Equipment Utilized During Treatment: Gait belt  Activity Tolerance: Patient tolerated treatment well Patient left: in bed;with call bell/phone within reach;with bed alarm set;with family/visitor present  OT Visit Diagnosis: Unsteadiness on feet (R26.81);Other abnormalities of gait and mobility (R26.89);Muscle weakness (generalized) (M62.81);Other symptoms and signs involving the nervous system (R29.898)                Time: 4098-1191 OT Time Calculation (min): 20 min Charges:  OT General Charges $OT Visit: 1 Visit OT Evaluation $OT Eval Moderate Complexity: 1 Mod  Derenda Mis, OTR/L Acute Rehabilitation Services Office [phone] Secure Chat Communication Preferred   Donia Pounds 07/26/2022, 11:40 AM

## 2022-07-26 NOTE — Progress Notes (Signed)
Pt is mouth breathing, tongue in midline, able to move side to side. Clear breath sounds, SPO2 96%. Tongue position seems to be blocking the airway making pt have hard time to breathe. Dr Antionette Char notified, seen the patient.Will continue to monitor SPO2

## 2022-07-26 NOTE — Progress Notes (Signed)
PROGRESS NOTE  Devon Davis  DOB: 02-Aug-1921  PCP: Devon Specking, MD ZOX:[id number]  DOA: 07/23/2022  LOS: 1 day  Hospital Day: 3  Brief narrative: Devon Davis is a 89 y.o. male who lives at home alone, doesn't have round-the-clock caregivers, PMH significant for recent right lacunar and left subacute inferior occipital stroke, prior strokes without significant residual deficit; history of DM2, HTN, HLD currently on DAPT, metastatic prostate cancer, f/u Devon Davis. He was just admitted at [facility] 5/2-5/3, where he had presented with left-sided weakness and slurred speech.  Found to have right lateral and left subacute inferior occipital stroke.  He was switched from previous regimen of aspirin and Plavix to aspirin and Brilinta and discharged to home.  At home, caregiver noted dysarthria, ambulatory stent, facial droop, left-sided weakness and hence he was brought to the ED last night.  Code stroke was activated CT head did not show any acute intracranial process  CT angio head and neck showed 1. Severe, near occlusive stenosis in the proximal and mid left P2, with poor flow in the left P3 segments. 2. Severe stenosis in the distal left cavernous and proximal left supraclinoid ICA. 3. Severe stenosis in the proximal right M1 and moderate stenosis in the distal right M1. Multifocal mild stenosis in the right MCA branches. 4. Mild stenosis in the proximal right P1 and proximal right P2. 5. No hemodynamically significant stenosis in the neck. 6. Aortic atherosclerosis.  MRI brain showed mild extension, enlargement of the right coronary due to/lentiform infarct compared to MRI from 2 days prior.  No history of hemorrhage or mass effect or new areas of ischemia.  Admitted to [facility] Seen by neurology  Subjective: Patient was seen and examined this morning.  Lying on bed.  Restless.  Moving his right extremities but I do not see any movement in his left extremities.  Mumbles  only.  Unable to have a meaningful conversation. No family at bedside.  Assessment and plan: Acute progression of CVA  Presented with recurrent stroke symptoms, likely due to severe intracranial atherosclerotic disease CTA, CT angio head and neck and MRI brain as above. Recently completed a stroke workup.  Echocardiogram 5/3 without any intracardiac source of embolism. Neurology consult appreciated. Per neurologic recommendation, patient has been started on Aspirin 81 mg daily and Brilinta 90 mg twice daily for 1 month  followed by aspirin 81 mg daily and Plavix 75 mg daily for 2 months.  Also to continue on atorvastatin 40 mg daily. However, because of n.p.o. status, patient has not been able to receive these medications.  Will switch to aspirin suppository this morning Permissive hypertension for first 24 to 48 hours.  Blood pressure mostly 140s Unable to participate with PT  GERD PPI  Long-term care Patient so far was living at home alone and has been resistant to idea of assisted living facility. His listed contact/friend requested to see if patient qualifies for SNF.  If patient's condition worsens in next 24-48 hrs, hospice would be an appropriate option.  Devon Davis offers being available at any time of the day/night for call if needed. 5/5, patient does not seem to be getting better.  Considering age, I think he is hospice appropriate.  Palliative care consult has been requested..    Mobility: Pending PT OT eval  Goals of care   Code Status: DNR.  Palliative care consultation called.  Hospice appropriate.    DVT prophylaxis:  enoxaparin (LOVENOX) injection 40 mg Start:  07/25/22 1000   Antimicrobials: None Fluid: None Consultants: Neurology Family Communication: Discussed with Dr. Cleotis Davis this morning.  Status: Inpatient Level of care:  Telemetry Medical   Patient from: Home Anticipated d/c to: Pending clinical course Needs to continue in-hospital care:   Pending PT/OT/ST eval    Diet:  Diet Order             Diet NPO time specified Except for: Ice Chips  Diet effective now                   Scheduled Meds:   stroke: early stages of recovery book   Does not apply Once   aspirin  300 mg Rectal Daily   enoxaparin (LOVENOX) injection  40 mg Subcutaneous Daily   ticagrelor  90 mg Oral BID    PRN meds: acetaminophen **OR** acetaminophen (TYLENOL) oral liquid 160 mg/5 mL **OR** acetaminophen, haloperidol lactate, senna-docusate   Infusions:    Antimicrobials: Anti-infectives (From admission, onward)    None       Nutritional status:  Body mass index is 28.62 kg/m.          Objective: Vitals:   07/26/22 0500 07/26/22 0727  BP: (!) 149/69 (!) 149/73  Pulse: 87 94  Resp: 20 16  Temp: 99.1 F (37.3 C) 98.6 F (37 C)  SpO2: 95% 96%    Intake/Output Summary (Last 24 hours) at 07/26/2022 1011 Last data filed at 07/26/2022 1610 Gross per 24 hour  Intake --  Output 1100 ml  Net -1100 ml    Filed Weights   07/25/22 0127  Weight: 87.9 kg   Weight change:  Body mass index is 28.62 kg/m.   Physical Exam: General exam: Pleasant, elderly Caucasian male.  Restless, unable to verbalize Skin: No rashes, lesions or ulcers. HEENT: Atraumatic, normocephalic, no obvious bleeding Lungs: Clear to auscultation bilaterally CVS: Regular rate and rhythm, no murmur GI/Abd soft, nontender, nondistended, bowel sound present CNS: Restless, moving right extremities.  No spontaneous movement of left extremity seen.  Opens eyes on verbal command, mumbles.  Clearly dysarthric, unable to have a meaningful conversation. Psychiatry: Anxious look Extremities: No pedal edema, no calf tenderness  Data Review: I have personally reviewed the laboratory data and studies available.  F/u labs ordered Unresulted Labs (From admission, onward)     Start     Ordered   08/01/22 0500  Creatinine, serum  (enoxaparin (LOVENOX)    CrCl >/=  30 ml/min)  Weekly,   R     Comments: while on enoxaparin therapy    07/25/22 0403            Total time spent in review of labs and imaging, patient evaluation, formulation of plan, documentation and communication with family: 45 minutes  Signed, Lorin Glass, MD [facility] 07/26/2022

## 2022-07-26 NOTE — Evaluation (Signed)
Physical Therapy Evaluation Patient Details Name: Devon Davis MRN: [id number] DOB: 07-20-1921 Today's Date: 07/26/2022  History of Present Illness  Devon Davis is a 89 y.o. male who presented after his caregiver noted dysarthria, ambulatory difficulty, facial droop, left-sided weakness on 07/27/2022. Pt found to have mild extension of R corona radiata/lentiform infarct. Pt admitted to [facility] 07/23/22-early 08/20/2022 with subacute R lentiform and L occipital stroke with symptoms resolving and returned home. PMH significant for prior strokes without significant residual deficit; history of DM2, HTN, HLD currently on DAPT, metastatic prostate cancer  Clinical Impression  Pt admitted with above diagnosis. At baseline, pt resides alone with intermittent caregivers.  He is normally independent with adls, ambulates with rollator, and manages meds.  Caregivers typically assist with IADLs.  Pt was at [facility] earlier this week with CVA but symptoms improved and was able to return home; however, at home symptoms worsened and he had to return to hospital.  Pt was lethargic at arrival but arousable and cooperative, followed simple commands with multimodal cues. Did note that he had received Haldol yesterday evening due to combativeness.  He required max x 2 for bed mobility and mod x 2 to stand.  Worked on EOB balance and able to progress to min A. He does have L sided weakness but not fully able to assess due to unable to follow MMT commands (at least 1/5 throughout).  Pt with good effort during session but did return to supine due to unsafe to leave in chair.  Pt currently with functional limitations due to the deficits listed below (see PT Problem List). Pt will benefit from acute skilled PT to increase their independence and safety with mobility to allow discharge.  Pt will benefit from post-acute rehab at a facility due to level of assist required, does not have 24 hr care, and well below baseline.         Recommendations for follow up therapy are one component of a multi-disciplinary discharge planning process, led by the attending physician.  Recommendations may be updated based on patient status, additional functional criteria and insurance authorization.  Follow Up Recommendations Can patient physically be transported by private vehicle: No     Assistance Recommended at Discharge Frequent or constant Supervision/Assistance  Patient can return home with the following  Two people to help with walking and/or transfers;Two people to help with bathing/dressing/bathroom    Equipment Recommendations Other (comment) (further assessment at SNF)  Recommendations for Other Services       Functional Status Assessment Patient has had a recent decline in their functional status and demonstrates the ability to make significant improvements in function in a reasonable and predictable amount of time.     Precautions / Restrictions Precautions Precautions: Fall Restrictions Weight Bearing Restrictions: No      Mobility  Bed Mobility Overal bed mobility: Needs Assistance Bed Mobility: Supine to Sit, Sit to Supine     Supine to sit: +2 for physical assistance, Max assist Sit to supine: +2 for physical assistance, Max assist   General bed mobility comments: Max of 2 to transition to sitting with posterior lean and legs blocked to sit.    Transfers Overall transfer level: Needs assistance Equipment used: 2 person hand held assist Transfers: Sit to/from Stand Sit to Stand: Mod assist, +2 physical assistance           General transfer comment: Mod x 2 to rise with use of belt and pad to assist.  Pt  did assist with leaning forward and pushing to stand.  Cues to stand straight and tuck bottom. Not safe to leave in chair (confused, sliding down in bed) so did not pivot.    Ambulation/Gait                  Stairs            Wheelchair Mobility    Modified Rankin  (Stroke Patients Only) Modified Rankin (Stroke Patients Only) Pre-Morbid Rankin Score: Moderate disability Modified Rankin: Severe disability     Balance Overall balance assessment: Needs assistance Sitting-balance support: Single extremity supported, Feet supported Sitting balance-Leahy Scale: Poor Sitting balance - Comments: Initially requiring mod/max A with posterior lean and pushing to L.  Had pt lean onto R elbow and then transitioned back up - improved balance/less pushing.  Requiring min A at EOB for at least 8 mins   Standing balance support: Bilateral upper extremity supported Standing balance-Leahy Scale: Poor Standing balance comment: mod A of 2 to rise and pt's trunk leaning forward; facilitated posture with knee blocked, support at bottom, and trying to lift shoulders; standing <30 sec                             Pertinent Vitals/Pain Pain Assessment Pain Assessment: No/denies pain    Home Living Family/patient expects to be discharged to:: Private residence Living Arrangements: Alone Available Help at Discharge: Personal care attendant;Available PRN/intermittently (Some assist during day and evenings but not 24/7) Type of Home: House Home Access: Level entry       Home Layout: One level Home Equipment: BSC/3in1;Tub bench;Rollator (4 wheels) Additional Comments: Pt unable to provide hx.  Obtained from prior admission and caregiver, Devon Davis    Prior Function Prior Level of Function : Needs assist             Mobility Comments: Caregiver reports that up until this event he was independent with rollator and would walk up/down driveway.  States last fall was 2 years ago ADLs Comments: Independent with ADL's. Assisted by care attendants for IADL's.     Hand Dominance        Extremity/Trunk Assessment   Upper Extremity Assessment Upper Extremity Assessment: Defer to OT evaluation    Lower Extremity Assessment Lower Extremity Assessment: LLE  deficits/detail;RLE deficits/detail;Difficult to assess due to impaired cognition RLE Deficits / Details: ROM WFL; appears to have at least 3/5 strength but not following further MMT commands LLE Deficits / Details: ROM WFL; MMT: Does appear weaker than R, not following MMT commands, at least 1/5 throughout    Cervical / Trunk Assessment Cervical / Trunk Assessment: Kyphotic  Communication   Communication: HOH  Cognition Arousal/Alertness: Awake/alert (easily aroused) Behavior During Therapy: WFL for tasks assessed/performed Overall Cognitive Status: Difficult to assess                                 General Comments: Caregiver reports pt typically manages his own meds.  Today, pt with R gaze preference but could attend to L with cues.  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 Hearing aide on L.  Difficult to fully assess cognition due to dysarthria.  Pt following some simple commands but requiring multimodal cues.  Did note pt received Haldol yesterday evening due to combativeness.  He was cooperative today.  General Comments      Exercises     Assessment/Plan    PT Assessment Patient needs continued PT services  PT Problem List Decreased strength;Decreased activity tolerance;Decreased balance;Decreased mobility;Decreased coordination;Decreased cognition;Decreased knowledge of use of DME;Decreased safety awareness;Decreased knowledge of precautions       PT Treatment Interventions DME instruction;Gait training;Functional mobility training;Therapeutic activities;Therapeutic exercise;Patient/family education;Balance training;Neuromuscular re-education;Cognitive remediation    PT Goals (Current goals can be found in the Care Plan section)  Acute Rehab PT Goals Patient Stated Goal: Per chart POA mentioned SNF PT Goal Formulation: Patient unable to participate in goal setting Time For Goal Achievement: 08/09/22 Potential to Achieve Goals:  Good    Frequency Min 3X/week     Co-evaluation PT/OT/SLP Co-Evaluation/Treatment: Yes Reason for Co-Treatment: Complexity of the patient's impairments (multi-system involvement);For patient/therapist safety (no tech available) PT goals addressed during session: Mobility/safety with mobility;Balance         AM-PAC PT "6 Clicks" Mobility  Outcome Measure Help needed turning from your back to your side while in a flat bed without using bedrails?: Total Help needed moving from lying on your back to sitting on the side of a flat bed without using bedrails?: Total Help needed moving to and from a bed to a chair (including a wheelchair)?: Total Help needed standing up from a chair using your arms (e.g., wheelchair or bedside chair)?: Total Help needed to walk in hospital room?: Total Help needed climbing 3-5 steps with a railing? : Total 6 Click Score: 6    End of Session Equipment Utilized During Treatment: Gait belt Activity Tolerance: Patient tolerated treatment well Patient left: in bed;with call bell/phone within reach;with bed alarm set;with family/visitor present (mitten on R) Nurse Communication: Mobility status PT Visit Diagnosis: Other abnormalities of gait and mobility (R26.89);Hemiplegia and hemiparesis Hemiplegia - Right/Left: Left Hemiplegia - dominant/non-dominant: Non-dominant Hemiplegia - caused by: Cerebral infarction    Time: 1610-9604 PT Time Calculation (min) (ACUTE ONLY): 25 min   Charges:   PT Evaluation $PT Eval Moderate Complexity: 1 Mod          Devon Davis, PT Acute Rehab [facility] [phone]   Rayetta Humphrey 07/26/2022, 11:17 AM

## 2022-07-26 NOTE — Progress Notes (Signed)
Speech Language Pathology Treatment: Dysphagia  Patient Details Name: Devon Davis MRN: [id number] DOB: 1922-03-03 Today's Date: 07/26/2022 Time: 0820-0828 SLP Time Calculation (min) (ACUTE ONLY): 8 min  Assessment / Plan / Recommendation Clinical Impression  ST follow up to assess for readiness for MBS.  RN approved patient for PO intake and reported he was more alert and less combative today.  He was presented with ice chips, thin liquids via straw dip and pureed material.  Decreased lip closure was noted with anterior escape seen given thin liquids and ice chips.  He was unable to maintain the ice chip in his oral cavity and entire ice chip was lost anteriorly.  Little oral manipulation was noted given pureed material.  Patient stated he had swallowed but he appeared to have entire bolus in his oral cavity requiring suction to clear.  Swallow trigger was appreciated to palpation and immediate strong cough response was seen given thin liquids.  He continues to present with ongoing concern for oral and pharyngeal dysphagia that has a cognitive component (ie oral holding).  Recommend that he remain NPO.  He does not appear to be ready for MBS that has been ordered.  ST will follow up tomorrow to assess for readiness for PO's vs ongoing need for MBS.    HPI HPI: Pt is a 89 y.o. male with medical history significant for stroke, kidney stone. Pt lives at home and has a caretaker who is his long-term friend there 24/7. Pt's caregiver reports some cognitive impairment (primarily memory deficit) at baseline. Patient presented to the ED with complaints of dizziness and weakness on 5/2 and was admitted at [facility] with acute CVA. Patient discharged home on DAPT.  On arriving home patient was fine.  Caregiver left.  When he returned at 10 PM patient was dysarthric, unable to stand, facial droop, left-sided weakness.  He was returned to the ER this AM, where he subsequently failed his [employer]. Pt  reported by nursing staff to be severely dysarthric. Pt had previous SLP evaluation where the pt presented with a mild dysarthria with speech intelligibility slightly reduced (75-100% intelligible) and cognitive impairment noted with memory and thought organization may be baseline or slightly worse than baseline. No s/sx of aspiration with previous hospitalization. Speech language evaluation and BSE requested at this time.      SLP Plan  Continue with current plan of care;MBS      Recommendations for follow up therapy are one component of a multi-disciplinary discharge planning process, led by the attending physician.  Recommendations may be updated based on patient status, additional functional criteria and insurance authorization.    Recommendations  Diet recommendations: NPO Medication Administration: Via alternative means                  Oral care BID   Frequent or constant Supervision/Assistance Dysphagia, unspecified (R13.10)     Continue with current plan of care;MBS     Dimas Aguas, MA, CCC-SLP Acute Rehab SLP [phone]  Fleet Contras  07/26/2022, 8:34 AM

## 2022-07-26 NOTE — Significant Event (Signed)
Patient's private caregiver [name] took home patient's black watch. Patient's only belongings at bedside at this time are the hearing aid batteries and one hearing aid (which is attached to patient's left ear).

## 2022-07-27 DIAGNOSIS — Z515 Encounter for palliative care: Secondary | ICD-10-CM | POA: Diagnosis not present

## 2022-07-27 DIAGNOSIS — I6330 Cerebral infarction due to thrombosis of unspecified cerebral artery: Secondary | ICD-10-CM | POA: Diagnosis not present

## 2022-07-27 DIAGNOSIS — Z66 Do not resuscitate: Secondary | ICD-10-CM

## 2022-07-27 DIAGNOSIS — R4182 Altered mental status, unspecified: Secondary | ICD-10-CM | POA: Diagnosis not present

## 2022-07-27 MED ORDER — ONDANSETRON HCL 4 MG/2ML IJ SOLN: 4.0000 mg | Freq: Four times a day (QID) | INTRAMUSCULAR | Status: DC | PRN

## 2022-07-27 MED ORDER — GLYCOPYRROLATE 0.2 MG/ML IJ SOLN: 0.2000 mg | INTRAMUSCULAR | Status: DC | PRN

## 2022-07-27 MED ORDER — MORPHINE SULFATE (PF) 2 MG/ML IV SOLN
1.0000 mg | INTRAVENOUS | Status: DC | PRN
  Administered 2022-07-27 (×2): 2 mg via INTRAVENOUS
  Filled 2022-07-27 (×2): qty 1

## 2022-07-27 MED ORDER — HYDRALAZINE HCL 20 MG/ML IJ SOLN
10.0000 mg | Freq: Four times a day (QID) | INTRAMUSCULAR | Status: DC | PRN
  Administered 2022-07-27 (×2): 10 mg via INTRAVENOUS
  Filled 2022-07-27 (×2): qty 1

## 2022-07-27 MED ORDER — BIOTENE DRY MOUTH MT LIQD: 15.0000 mL | OROMUCOSAL | Status: DC | PRN

## 2022-07-27 MED ORDER — GLYCOPYRROLATE 1 MG PO TABS: 1.0000 mg | ORAL_TABLET | ORAL | Status: DC | PRN

## 2022-07-27 MED ORDER — ONDANSETRON 4 MG PO TBDP: 4.0000 mg | ORAL_TABLET | Freq: Four times a day (QID) | ORAL | Status: DC | PRN

## 2022-07-27 MED ORDER — LORAZEPAM 2 MG/ML IJ SOLN
1.0000 mg | INTRAMUSCULAR | Status: DC | PRN
  Administered 2022-07-27: 2 mg via INTRAVENOUS
  Filled 2022-07-27: qty 1

## 2022-07-27 MED ORDER — POLYVINYL ALCOHOL 1.4 % OP SOLN: 1.0000 [drp] | Freq: Four times a day (QID) | OPHTHALMIC | Status: DC | PRN

## 2022-07-27 NOTE — Progress Notes (Signed)
PROGRESS NOTE  Devon Davis  DOB: 08-Dec-1921  PCP: Ignatius Specking, MD ZOX:[id number]  DOA: Jul 26, 2022  LOS: 2 days  Hospital Day: 4  Brief narrative: Devon Davis is a 89 y.o. male who lives at home alone, doesn't have round-the-clock caregivers, PMH significant for recent right lacunar and left subacute inferior occipital stroke, prior strokes without significant residual deficit; history of DM2, HTN, HLD currently on DAPT, metastatic prostate cancer, f/u Dr. Annabell Howells. He was just admitted at [facility] 5/2-5/3, where he had presented with left-sided weakness and slurred speech.  Found to have right lateral and left subacute inferior occipital stroke.  He was switched from previous regimen of aspirin and Plavix to aspirin and Brilinta and discharged to home.  At home, caregiver noted dysarthria, ambulatory stent, facial droop, left-sided weakness and hence he was brought to the ED last night.  Code stroke was activated CT head did not show any acute intracranial process  CT angio head and neck showed 1. Severe, near occlusive stenosis in the proximal and mid left P2, with poor flow in the left P3 segments. 2. Severe stenosis in the distal left cavernous and proximal left supraclinoid ICA. 3. Severe stenosis in the proximal right M1 and moderate stenosis in the distal right M1. Multifocal mild stenosis in the right MCA branches. 4. Mild stenosis in the proximal right P1 and proximal right P2. 5. No hemodynamically significant stenosis in the neck. 6. Aortic atherosclerosis.  MRI brain showed mild extension, enlargement of the right coronary due to/lentiform infarct compared to MRI from 2 days prior.  No history of hemorrhage or mass effect or new areas of ischemia. Seen by neurology Admitted to [facility] for stroke workup  His hospital course has been complicated by persistent altered mental status, inability to swallow. Per discussion with his friend/POA, I have ordered for palliative  consultation for hospice consideration.  Subjective: Patient was seen and examined this morning.  Lying on bed.  Restless.  No movement in left extremities.  Spontaneous movement of right extremity seen.  Unable to pass swallow eval.  Unable to follow any commands for me.  Assessment and plan: Acute progression of CVA  Presented with recurrent stroke symptoms, likely due to severe intracranial atherosclerotic disease CTA, CT angio head and neck and MRI brain as above. Recently completed a stroke workup.  Echocardiogram 5/3 without any intracardiac source of embolism. Neurology consult appreciated. Per neurologic recommendation, patient has been started on Aspirin 81 mg daily and Brilinta 90 mg twice daily for 1 month  followed by aspirin 81 mg daily and Plavix 75 mg daily for 2 months.  Also to continue on atorvastatin 40 mg daily. However, because of n.p.o. status, patient has not been able to receive these medications.  Will switch to aspirin suppository this morning Permissive hypertension for first 24 to 48 hours.  Blood pressure mostly 140s Unable to pass swallow eval.  Remains n.p.o.  GERD PPI  Long-term care Patient so far was living at home alone and has been resistant to idea of assisted living facility. I have a conversation with the patient friend/POA Dr. Marcell Anger who is a retired Care[employer] adviser.  Given patient's advanced age, debility worsened by acute illness, would like to consider hospice services.   Palliative care and hospice consultation called.  Goals of care   Code Status: DNR.  Palliative care consultation called.  Hospice appropriate.    DVT prophylaxis:  enoxaparin (LOVENOX) injection 40 mg Start: 07/25/22 1000   Antimicrobials:  None Fluid: None Consultants: Neurology Family Communication: Discussed on 5/5  Status: Inpatient Level of care:  Telemetry Medical   Patient from: Home Anticipated d/c to: Residential hospice likely  Needs to continue in-hospital  care:  Pending palliative eval    Diet:  Diet Order             Diet NPO time specified Except for: Ice Chips  Diet effective now                   Scheduled Meds:   stroke: early stages of recovery book   Does not apply Once   aspirin  300 mg Rectal Daily   enoxaparin (LOVENOX) injection  40 mg Subcutaneous Daily   ticagrelor  90 mg Oral BID    PRN meds: acetaminophen **OR** acetaminophen (TYLENOL) oral liquid 160 mg/5 mL **OR** acetaminophen, haloperidol lactate, hydrALAZINE, senna-docusate   Infusions:    Antimicrobials: Anti-infectives (From admission, onward)    None       Nutritional status:  Body mass index is 28.62 kg/m.          Objective: Vitals:   07/27/22 1103 07/27/22 1141  BP: (!) 188/87 (!) 188/80  Pulse: (!) 105   Resp: 16   Temp: 98 F (36.7 C)   SpO2: 96%     Intake/Output Summary (Last 24 hours) at 07/27/2022 1326 Last data filed at 07/26/2022 1830 Gross per 24 hour  Intake --  Output 325 ml  Net -325 ml    Filed Weights   07/25/22 0127  Weight: 87.9 kg   Weight change:  Body mass index is 28.62 kg/m.   Physical Exam: General exam: Pleasant, elderly Caucasian male.  Restless, unable to verbalize Skin: No rashes, lesions or ulcers. HEENT: Atraumatic, normocephalic, no obvious bleeding Lungs: Clear to auscultation bilaterally CVS: Regular rate and rhythm, no murmur GI/Abd soft, nontender, nondistended, bowel sound present CNS: Restless, moving right extremities.  No spontaneous movement of left extremities seen.  Mumbles.  Unable to have a meaningful conversation. Psychiatry: Anxious look Extremities: No pedal edema, no calf tenderness  Data Review: I have personally reviewed the laboratory data and studies available.  F/u labs ordered Unresulted Labs (From admission, onward)     Start     Ordered   08/01/22 0500  Creatinine, serum  (enoxaparin (LOVENOX)    CrCl >/= 30 ml/min)  Weekly,   R     Comments: while on  enoxaparin therapy    07/25/22 0403            Total time spent in review of labs and imaging, patient evaluation, formulation of plan, documentation and communication with family: 45 minutes  Signed, Lorin Glass, MD [facility] 07/27/2022

## 2022-07-27 NOTE — Consult Note (Signed)
Palliative Care Consult Note                                  Date: 07/27/2022   Patient Name: Devon Davis  DOB: 1921/06/07  MRN: [id number]  Age / Sex: 89 y.o., male  PCP: Devon Specking, MD Referring Physician: Lorin Glass, MD  Reason for Consultation: Establishing goals of care  HPI/Patient Profile: 89 y.o. male  with past medical history of metastatic prostate cancer, DMT2, HTN, HLD, and history of previous strokes without significant residual deficit.  He was recently hospitalized at [facility] 5/2 through 08-14-22 where he was found to have right lateral and left subacute inferior occipital stroke.  At home, his caregiver noted dysarthria, facial droop, and left-sided weakness.  He was readmitted on 08/14/22.  MRI brain showed mild extension, enlargement of the right corona radiata/lentiform infarct since MRI  from 2 days ago.  Palliative Medicine was consulted for goals of care in the setting of "seems hospice appropriate".    Subjective:   I have reviewed medical records including progress notes, labs and imaging, and assessed the patient at bedside. He is lying in bed with his eyes closed; appears mildly restless. He is unresponsive to voice. Sonorous respirations noted. His caregiver/Devon Davis is at bedside.   I spoke with his friend/HCPOA Devon Davis by phone to discuss diagnosis, prognosis, GOC, EOL wishes, disposition, and options.  I introduced Palliative Medicine as specialized medical care for people living with serious illness. It focuses on providing relief from the symptoms and stress of a serious illness.   We discussed patient's current illness and what it means in the larger context of his/her ongoing co-morbidities. Current clinical status was reviewed. Natural trajectory at end-of-life was discussed.  Values and goals of care were attempted to be elicited.  I confirmed with Dr. Cleotis Davis that goal of care is comfort  and dignity. Reviewed that comfort care means avoiding life-prolonging interventions and allowing a natural course to occur. Reviewed hospice philosophy and provided information on home versus residential hospice services.  Dr. Cleotis Davis expresses interest in transfer to the hospice facility in [facility]. Questions and concerns were addressed.    Review of Systems  Unable to perform ROS   Objective:   Primary Diagnoses: Present on Admission:  Acute CVA (cerebrovascular accident) (HCC)  GERD (gastroesophageal reflux disease)  CVA (cerebral vascular accident) [facility])   Physical Exam Vitals reviewed.  Constitutional:      General: He is not in acute distress.    Appearance: He is ill-appearing.  Pulmonary:     Effort: Tachypnea present. No respiratory distress.  Neurological:     Mental Status: He is unresponsive.     Vital Signs:  BP (!) 175/103 (BP Location: Right Arm)   Pulse (!) 105   Temp 98.9 F (37.2 C)   Resp 18   Ht 5\' 9"  (1.753 m)   Wt 87.9 kg   SpO2 96%   BMI 28.62 kg/m   Palliative Assessment/Data: PPS 10%     Assessment & Plan:   SUMMARY OF RECOMMENDATIONS   Full comfort measures Referral to hospice facility in [facility] Ongoing palliative support  Symptom Management:  Morphine prn for pain or dyspnea Lorazepam (ATIVAN) prn for anxiety Haloperidol (HALDOL) prn for agitation  Glycopyrrolate (ROBINUL) for excessive secretions Ondansetron (ZOFRAN) prn for nausea Polyvinyl alcohol (LIQUIFILM TEARS) prn for dry eyes Antiseptic oral rinse (BIOTENE) prn  for dry mouth  Primary Decision Maker: HCPOA  Code Status/Advance Care Planning: DNR  Prognosis:  < 2 weeks   Thank you for allowing [place] to participate in the care of Devon Davis  MDM - High  Signed by: Sherlean Foot, NP Palliative Medicine Team  Team Phone # [phone]  For individual providers, please see AMION

## 2022-07-27 NOTE — TOC Initial Note (Signed)
Transition of Care [facility]) - Initial/Assessment Note    Patient Details  Name: Devon Davis MRN: [id number] Date of Birth: 06/20/1921  Transition [facility]) CM/SW Contact:    Baldemar Lenis, LCSW Phone Number: 07/27/2022, 2:51 PM  Clinical Narrative:          CSW contacted by MD who spoke with patient's HCPOA, Dr. Marcell Anger, that he wants patient to transition to comfort care, preference for [facility]. He has already called to discuss and they will need the referral. CSW contacted [facility] and provided referral. [facility] will need to send a nurse out tomorrow to assess patient, they will call back with a time. CSW to follow.         Expected Discharge Plan: Hospice Medical Facility Barriers to Discharge: Hospice Bed not available   Patient Goals and CMS Choice Patient states their goals for this hospitalization and ongoing recovery are:: patient unable to participate in goal setting, not oriented CMS Medicare.gov Compare Post Acute Care list provided to:: Patient Represenative (must comment) Choice offered to / list presented to : [facility] POA / Guardian [place] ownership interest in [facility].provided to:: [facility] POA / Guardian    Expected Discharge Plan and Services     Post Acute Care Choice: Hospice Living arrangements for the past 2 months: Single Family Home                                      Prior Living Arrangements/Services Living arrangements for the past 2 months: Single Family Home Lives with:: Self Patient language and need for interpreter reviewed:: No        Need for Family Participation in Patient Care: Yes (Comment) Care giver support system in place?: No (comment) Current home services: DME Criminal Activity/Legal Involvement Pertinent to Current Situation/Hospitalization: No - Comment as needed  Activities of Daily Living Home Assistive Devices/Equipment: Environmental consultant (specify type), Hearing aid,  Eyeglasses ADL Screening (condition at time of admission) Patient's cognitive ability adequate to safely complete daily activities?: No Is the patient deaf or have difficulty hearing?: Yes Does the patient have difficulty seeing, even when wearing glasses/contacts?: Yes Does the patient have difficulty concentrating, remembering, or making decisions?: Yes Patient able to express need for assistance with ADLs?: No Does the patient have difficulty dressing or bathing?: Yes Independently performs ADLs?: No Does the patient have difficulty walking or climbing stairs?: Yes Weakness of Legs: Both Weakness of Arms/Hands: Both  Permission Sought/Granted Permission sought to share information with : Family Supports, Oceanographer granted to share information with : Yes, Verbal Permission Granted  Share Information with NAME: Sherilyn Cooter  Permission granted to share info w AGENCY: [facility]  Permission granted to share info w Relationship: HCPOA/Friend     Emotional Assessment   Attitude/Demeanor/Rapport: Unable to Assess Affect (typically observed): Unable to Assess Orientation: : Oriented to Self Alcohol / Substance Use: Not Applicable Psych Involvement: No (comment)  Admission diagnosis:  CVA (cerebral vascular accident) (HCC) [I63.9] Altered mental status, unspecified altered mental status type [R41.82] Cerebrovascular accident (CVA), unspecified mechanism (HCC) [I63.9] Patient Active Problem List   Diagnosis Date Noted   CVA (cerebral vascular accident) (HCC) 07/25/2022   Acute CVA (cerebrovascular accident) (HCC) 07/23/2022   GERD (gastroesophageal reflux disease) 07/23/2022   Bladder stones 07/26/2020   PCP:  Ignatius Specking, MD Pharmacy:   [facility] -  [place], [place] - [address] [place] [address] Phone: [phone] Fax: [phone]     Social Determinants of Health (SDOH) Social History: SDOH Screenings    Food Insecurity: No Food Insecurity (07/23/2022)  Housing: Low Risk  (07/23/2022)  Transportation Needs: No Transportation Needs (07/23/2022)  Utilities: Patient Unable To Answer (07/25/2022)  Tobacco Use: Low Risk  (07/25/2022)   SDOH Interventions:     Readmission Risk Interventions    08/11/2022   11:37 AM  Readmission Risk Prevention Plan  Medication Screening Complete  Transportation Screening Complete

## 2022-07-27 NOTE — Progress Notes (Signed)
Speech Language Pathology Treatment: Dysphagia  Patient Details Name: Devon Davis MRN: [id number] DOB: 1921/09/25 Today's Date: 07/27/2022 Time: 8657-8469 SLP Time Calculation (min) (ACUTE ONLY): 11 min  Assessment / Plan / Recommendation Clinical Impression  Pt seen and unable to recommend po's at this time given pt's lethargy, decreased awareness and decreased saliva management. Labored respiratory pattern on arrival, pt kept eyes closed and mumbling several words but unintelligible and unable to follow commands. Dried secretions removed from hard palate. Ice chip fell from oral cavity x 1 but able to initiate mastication several times and initiate a weak appearing partial swallow x 1 with immediate cough. He was unable to cough on command. RN stated there is a Palliative care consult and SLP in agreement with for goals of care discussion with family re: nutritional status. Status appears decreased from SLP's note from yesterday. If he becomes alert and following oral care he may have ice chips. ST will follow along for plan.   HPI HPI: Pt is a 89 y.o. male with medical history significant for stroke, kidney stone. Pt lives at home and has a caretaker who is his long-term friend there 24/7. Pt's caregiver reports some cognitive impairment (primarily memory deficit) at baseline. Patient presented to the ED with complaints of dizziness and weakness on 5/2 and was admitted at [facility] with acute CVA. Patient discharged home on DAPT.  On arriving home patient was fine.  Caregiver left.  When he returned at 10 PM patient was dysarthric, unable to stand, facial droop, left-sided weakness.  He was returned to the ER this AM, where he subsequently failed his [employer]. Pt reported by nursing staff to be severely dysarthric. Pt had previous SLP evaluation where the pt presented with a mild dysarthria with speech intelligibility slightly reduced (75-100% intelligible) and cognitive impairment  noted with memory and thought organization may be baseline or slightly worse than baseline. No s/sx of aspiration with previous hospitalization. Speech language evaluation and BSE requested at this time.      SLP Plan  Continue with current plan of care      Recommendations for follow up therapy are one component of a multi-disciplinary discharge planning process, led by the attending physician.  Recommendations may be updated based on patient status, additional functional criteria and insurance authorization.    Recommendations  Diet recommendations: NPO (ice chips if alert) Medication Administration: Via alternative means                  Oral care QID   Frequent or constant Supervision/Assistance Dysphagia, unspecified (R13.10)     Continue with current plan of care     Royce Macadamia  07/27/2022, 9:51 AM

## 2022-08-22 NOTE — Death Summary Note (Signed)
DEATH SUMMARY   Patient Details  Name: Devon Davis MRN: [id number] DOB: December 16, 1921 WUJ:WJXB, Angelina Pih, MD Admission/Discharge Information   Admit Date:  07-29-22  Date of Death: Date of Death: 08/02/2022  Time of Death: Time of Death: 0000  Length of Stay: 3   Principle Cause of death: Stroke  Hospital Diagnoses: Principal Problem:   CVA (cerebral vascular accident) [facility]) Active Problems:   Acute CVA (cerebrovascular accident) (HCC)   GERD (gastroesophageal reflux disease)  Hospital Course: Devon Davis is a 89 y.o. male who lives at home alone, doesn't have round-the-clock caregivers, PMH significant for recent right lacunar and left subacute inferior occipital stroke, prior strokes without significant residual deficit; history of DM2, HTN, HLD currently on DAPT, metastatic prostate cancer, f/u Dr. Annabell Howells. He was just admitted at [facility] 5/2-5/3, where he had presented with left-sided weakness and slurred speech.  Found to have right lateral and left subacute inferior occipital stroke.  He was switched from previous regimen of aspirin and Plavix to aspirin and Brilinta and discharged to home.  At home, caregiver noted dysarthria, ambulatory stent, facial droop, left-sided weakness and hence he was brought to the ED last night.   Code stroke was activated CT head did not show any acute intracranial process   CT angio head and neck showed 1. Severe, near occlusive stenosis in the proximal and mid left P2, with poor flow in the left P3 segments. 2. Severe stenosis in the distal left cavernous and proximal left supraclinoid ICA. 3. Severe stenosis in the proximal right M1 and moderate stenosis in the distal right M1. Multifocal mild stenosis in the right MCA branches. 4. Mild stenosis in the proximal right P1 and proximal right P2. 5. No hemodynamically significant stenosis in the neck. 6. Aortic atherosclerosis.   MRI brain showed mild extension, enlargement of the  right coronary due to/lentiform infarct compared to MRI from 2 days prior.  No history of hemorrhage or mass effect or new areas of ischemia. Seen by neurology Admitted to [facility] for stroke workup   His hospital course has been complicated by persistent altered mental status.  He was not able to follow commands to participate in physical therapy.  He was unable to swallow safely and hence could not be given any oral medicines or nutrition by mouth.  Because of his advanced age, his clinical status rapidly declined. After discussion with his friend/POA, he was started on comfort care measures and a tentative plan of hospice discharge was devised.  While the process was in preparation, patient expired at midnight last night.  I called and expressed my condolence to his friend Mr. [name] this morning.       Procedures:   Consultations:   The results of significant diagnostics from this hospitalization (including imaging, microbiology, ancillary and laboratory) are listed below for reference.   Significant Diagnostic Studies: MR BRAIN WO CONTRAST  Result Date: 07/25/2022 CLINICAL DATA:  89 year old male left side weakness, speech difficulty. Small recent right corona radiata/lentiform ischemia on MRI 2 days ago. Near occluded left PCA, severe distal left ICA and right MCA stenoses by CTA today. EXAM: MRI HEAD WITHOUT CONTRAST TECHNIQUE: Multiplanar, multiecho pulse sequences of the brain and surrounding structures were obtained without intravenous contrast. COMPARISON:  CTA head and neck 0337 hours today. Recent brain MRI 07/23/2022. FINDINGS: Brain: Coronal T2 and axial T1 weighted imaging today is motion degraded despite repeated imaging attempts. Mild progression of patchy, linear diffusion restriction tracking from the  posterior right corona radiata into the right lentiform (series 5, image 79 today versus series 5, image 19 on 07/23/2022. But no convincing new areas of restricted diffusion.  Stable developing encephalomalacia in the posterior left temporal and occipital lobes compatible with nonacute ischemia (faint abnormal trace diffusion and primarily facilitated on ADC). Mild if any hemosiderin there. And stable chronic small vessel disease elsewhere including bilateral cerebral white matter, bilateral deep gray matter nuclei chronic lacune. Brainstem and cerebellum relatively spared. No other convincing cortical encephalomalacia or chronic cerebral blood products. No midline shift, mass effect, evidence of mass lesion, ventriculomegaly, extra-axial collection or acute intracranial hemorrhage. Cervicomedullary junction and pituitary are within normal limits. Vascular: Major intracranial vascular flow voids are stable. Skull and upper cervical spine: Negative for age visible cervical spine. Visualized bone marrow signal is within normal limits. Sinuses/Orbits: Stable, negative. Other: None. IMPRESSION: 1. Mild extension, enlargement of the right corona radiata/lentiform infarct since MRI two days ago. No associated hemorrhage or mass effect. No new areas of ischemia. 2. Stable ischemic changes elsewhere. Electronically Signed   By: Odessa Fleming M.D.   On: 07/25/2022 04:27   CT ANGIO HEAD NECK W WO CM  Result Date: 07/25/2022 CLINICAL DATA:  Left-sided weakness, speech difficulties EXAM: CT ANGIOGRAPHY HEAD AND NECK WITH AND WITHOUT CONTRAST TECHNIQUE: Multidetector CT imaging of the head and neck was performed using the standard protocol during bolus administration of intravenous contrast. Multiplanar CT image reconstructions and MIPs were obtained to evaluate the vascular anatomy. Carotid stenosis measurements (when applicable) are obtained utilizing NASCET criteria, using the distal internal carotid diameter as the denominator. RADIATION DOSE REDUCTION: This exam was performed according to the departmental dose-optimization program which includes automated exposure control, adjustment of the mA  and/or kV according to patient size and/or use of iterative reconstruction technique. CONTRAST:  75mL OMNIPAQUE IOHEXOL 350 MG/ML SOLN COMPARISON:  No prior CTA available, correlation is made with CT head 07/25/2022 FINDINGS: CT HEAD FINDINGS For noncontrast findings, please see 07/25/2022 CT head. CTA NECK FINDINGS Aortic arch: Standard branching. Imaged portion shows no evidence of aneurysm or dissection. No significant stenosis of the major arch vessel origins. Aortic atherosclerosis. Right carotid system: No evidence of dissection, occlusion, or hemodynamically significant stenosis (greater than 50%). Left carotid system: No evidence of dissection, occlusion, or hemodynamically significant stenosis (greater than 50%). Vertebral arteries: No evidence of dissection, occlusion, or hemodynamically significant stenosis (greater than 50%). Skeleton: No acute osseous abnormality. Degenerative changes in the cervical spine. Other neck: Hypoenhancing nodule in the right thyroid lobe measures up to 9 mm, for which no follow-up is currently indicated. (Reference: J Am Coll Radiol. 2015 Feb;12(2): 143-50) Upper chest: No focal pulmonary opacity or pleural effusion. Review of the MIP images confirms the above findings CTA HEAD FINDINGS Anterior circulation: Both internal carotid arteries are patent to the termini, with moderate stenosis in the right cavernous segment and severe stenosis in distal left cavernous and proximal left supraclinoid ICA. Patent right A1. With severely hypoplastic left A1. Multifocal moderate stenosis in the right A2 and A3 (series 7, images 83-84 and 94). No significant stenosis in the left ACA. Severe stenosis in the proximal right M1 and moderate stenosis in the distal right M1 (series 7, image 109). No significant stenosis in the left M1. Multifocal mild stenosis in the right MCA branches (series 7, image 115, for example). No significant stenosis in the left MCA branches. Stenosis. Posterior  circulation: Vertebral arteries patent to the vertebrobasilar junction without significant stenosis.  Posterior inferior cerebellar arteries patent proximally. Basilar patent to its distal aspect without significant stenosis. Superior cerebellar arteries patent proximally. Patent P1 segments. Severe stenosis in the proximal left P2 (series 7, image 115), mid P2 (series 7, image 120), and slightly more distal P2 (series 7, image 122), with poor flow in the left P3 segments. Mild stenosis in the proximal right P 2 (series 7, image 121) and mid right P1 (series 7, image 124). No other significant stenosis in the right PCA. The left posterior communicating artery is diminutive but patent. Venous sinuses: As permitted by contrast timing, patent. Anatomic variants: None significant. Review of the MIP images confirms the above findings IMPRESSION: 1. Severe, near occlusive stenosis in the proximal and mid left P2, with poor flow in the left P3 segments. 2. Severe stenosis in the distal left cavernous and proximal left supraclinoid ICA. 3. Severe stenosis in the proximal right M1 and moderate stenosis in the distal right M1. Multifocal mild stenosis in the right MCA branches. 4. Mild stenosis in the proximal right P1 and proximal right P2. 5. No hemodynamically significant stenosis in the neck. 6. Aortic atherosclerosis. Aortic Atherosclerosis (ICD10-I70.0). Imaging results were communicated on 07/25/2022 at 4:07 am to provider BHAGAT via secure text paging. Electronically Signed   By: Wiliam Ke M.D.   On: 07/25/2022 04:08   DG Chest Portable 1 View  Result Date: 07/25/2022 CLINICAL DATA:  Difficulty breathing EXAM: PORTABLE CHEST 1 VIEW COMPARISON:  01/30/2017 FINDINGS: Cardiomegaly. Mediastinal contours within normal limits. Aortic atherosclerosis. No confluent airspace opacities, effusions or edema. IMPRESSION: Mild cardiomegaly.  No active disease. Electronically Signed   By: Charlett Nose M.D.   On: 07/25/2022 01:10    CT HEAD CODE STROKE WO CONTRAST  Result Date: 07/25/2022 CLINICAL DATA:  Code stroke. EXAM: CT HEAD WITHOUT CONTRAST TECHNIQUE: Contiguous axial images were obtained from the base of the skull through the vertex without intravenous contrast. RADIATION DOSE REDUCTION: This exam was performed according to the departmental dose-optimization program which includes automated exposure control, adjustment of the mA and/or kV according to patient size and/or use of iterative reconstruction technique. COMPARISON:  No prior CT head available, correlation is made with MRI head 07/23/2022 FINDINGS: Brain: No evidence of acute infarction, hemorrhage, mass, mass effect, or midline shift. No hydrocephalus or extra-axial collection. Periventricular white matter changes, likely the sequela of chronic small vessel ischemic disease. Vascular: No hyperdense vessel. Atherosclerotic calcifications in the intracranial carotid and vertebral arteries. Skull: Negative for fracture or focal lesion. Sinuses/Orbits: No acute finding. Other: The mastoid air cells are well aerated. ASPECTS [facility] Stroke Program Early CT Score) - Ganglionic level infarction (caudate, lentiform nuclei, internal capsule, insula, M1-M3 cortex): 7 - Supraganglionic infarction (M4-M6 cortex): 3 Total score (0-10 with 10 being normal): 10 IMPRESSION: 1. No acute intracranial process. 2. ASPECTS is 10. Imaging results were communicated on 07/25/2022 at 12:05 am to provider BHAGAT via secure text paging. Electronically Signed   By: Wiliam Ke M.D.   On: 07/25/2022 00:05   ECHOCARDIOGRAM COMPLETE  Result Date: 2022-08-05    ECHOCARDIOGRAM REPORT   Patient Name:   WILLAIM DOUBERLY Date of Exam: 05-Aug-2022 Medical Rec #:  [id number]          Height:       69.0 in Accession #:    [id number]         Weight:       193.8 lb Date of Birth:  06/28/1921  BSA:          2.038 m Patient Age:    89 years           BP:           169/75 mmHg Patient Gender: M                   HR:           79 bpm. [facility] Procedure: 2D Echo, Cardiac Doppler, Color Doppler and Intracardiac            Opacification Agent Indications:   Stroke  History:       Patient has no prior history of Echocardiogram examinations.                Stroke.  Sonographer:   Milda Smart Referring      [id number] Onnie Boer Phys:  Sonographer Comments: Technically difficult study due to poor echo windows. Image acquisition challenging due to patient body habitus and Image acquisition challenging due to respiratory motion. IMPRESSIONS  1. Left ventricular ejection fraction, by estimation, is 55 to 60%. The left ventricle has normal function. The left ventricle has no regional wall motion abnormalities. Left ventricular diastolic parameters are consistent with Grade I diastolic dysfunction (impaired relaxation).  2. Right ventricular systolic function was not well visualized. The right ventricular size is normal. There is normal pulmonary artery systolic pressure.  3. The mitral valve is normal in structure. No evidence of mitral valve regurgitation. No evidence of mitral stenosis.  4. The aortic valve was not well visualized. There is mild calcification of the aortic valve. Aortic valve regurgitation is mild to moderate. Mild aortic valve stenosis. Aortic valve mean gradient measures 12.0 mmHg. Aortic valve Vmax measures 2.24 m/s.  5. The inferior vena cava is normal in size with greater than 50% respiratory variability, suggesting right atrial pressure of 3 mmHg. Comparison(s): No prior Echocardiogram. FINDINGS  Left Ventricle: Left ventricular ejection fraction, by estimation, is 55 to 60%. The left ventricle has normal function. The left ventricle has no regional wall motion abnormalities. Definity contrast agent was given IV to delineate the left ventricular  endocardial borders. The left ventricular internal cavity size was normal in size. There is no left ventricular hypertrophy. Left  ventricular diastolic parameters are consistent with Grade I diastolic dysfunction (impaired relaxation). Right Ventricle: The right ventricular size is normal. No increase in right ventricular wall thickness. Right ventricular systolic function was not well visualized. There is normal pulmonary artery systolic pressure. The tricuspid regurgitant velocity is  1.99 m/s, and with an assumed right atrial pressure of 3 mmHg, the estimated right ventricular systolic pressure is 18.8 mmHg. Left Atrium: Left atrial size was normal in size. Right Atrium: Right atrial size was normal in size. Pericardium: There is no evidence of pericardial effusion. Mitral Valve: The mitral valve is normal in structure. No evidence of mitral valve regurgitation. No evidence of mitral valve stenosis. Tricuspid Valve: The tricuspid valve is normal in structure. Tricuspid valve regurgitation is trivial. No evidence of tricuspid stenosis. Aortic Valve: The aortic valve was not well visualized. There is mild calcification of the aortic valve. Aortic valve regurgitation is mild to moderate. Mild aortic stenosis is present. Aortic valve mean gradient measures 12.0 mmHg. Aortic valve peak gradient measures 20.1 mmHg. Aortic valve area, by VTI measures 1.27 cm. Pulmonic Valve: The pulmonic valve was not well visualized. Pulmonic valve regurgitation is not visualized. No evidence of  pulmonic stenosis. Aorta: The aortic root and ascending aorta are structurally normal, with no evidence of dilitation. Venous: The inferior vena cava is normal in size with greater than 50% respiratory variability, suggesting right atrial pressure of 3 mmHg. IAS/Shunts: The interatrial septum was not well visualized.  LEFT VENTRICLE PLAX 2D LVIDd:         5.30 cm   Diastology LVIDs:         3.80 cm   LV e' medial:    2.82 cm/s LV PW:         1.10 cm   LV E/e' medial:  12.3 LV IVS:        0.90 cm   LV e' lateral:   3.57 cm/s LVOT diam:     2.00 cm   LV E/e' lateral: 9.7  LV SV:         52 LV SV Index:   25 LVOT Area:     3.14 cm  RIGHT VENTRICLE RV S prime:     8.52 cm/s TAPSE (M-mode): 2.3 cm LEFT ATRIUM             Index        RIGHT ATRIUM           Index LA diam:        4.20 cm 2.06 cm/m   RA Area:     12.80 cm LA Vol (A2C):   53.4 ml 26.20 ml/m  RA Volume:   27.20 ml  13.34 ml/m LA Vol (A4C):   47.5 ml 23.30 ml/m LA Biplane Vol: 50.5 ml 24.77 ml/m  AORTIC VALVE AV Area (Vmax):    1.25 cm AV Area (Vmean):   1.19 cm AV Area (VTI):     1.27 cm AV Vmax:           224.00 cm/s AV Vmean:          167.000 cm/s AV VTI:            0.409 m AV Peak Grad:      20.1 mmHg AV Mean Grad:      12.0 mmHg LVOT Vmax:         88.80 cm/s LVOT Vmean:        63.100 cm/s LVOT VTI:          0.165 m LVOT/AV VTI ratio: 0.40  AORTA Ao Root diam: 3.40 cm Ao Asc diam:  3.40 cm MITRAL VALVE                TRICUSPID VALVE MV Area (PHT): 3.13 cm     TR Peak grad:   15.8 mmHg MV Decel Time: 242 msec     TR Vmax:        199.00 cm/s MV E velocity: 34.70 cm/s MV A velocity: 103.00 cm/s  SHUNTS MV E/A ratio:  0.34         Systemic VTI:  0.16 m                             Systemic Diam: 2.00 cm Vishnu Priya Mallipeddi Electronically signed by Winfield Rast Mallipeddi Signature Date/Time: 08-12-22/2:54:40 PM    Final    US Carotid Bilateral (at [facility] and [facility] only)  Result Date: 08/12/2022 CLINICAL DATA:  CVA.  Transient left lower extremity weakness. EXAM: BILATERAL CAROTID DUPLEX ULTRASOUND TECHNIQUE: Wallace Cullens scale imaging, color Doppler and duplex ultrasound were performed of bilateral carotid and vertebral arteries in the  neck. COMPARISON:  None Available. FINDINGS: Criteria: Quantification of carotid stenosis is based on velocity parameters that correlate the residual internal carotid diameter with NASCET-based stenosis levels, using the diameter of the distal internal carotid lumen as the denominator for stenosis measurement. The following velocity measurements were obtained: RIGHT ICA: 68/22 cm/sec CCA:  68/7 cm/sec SYSTOLIC ICA/CCA RATIO:  1.0 ECA: 102 cm/sec LEFT ICA: 40/7 cm/sec CCA: 74/7 cm/sec SYSTOLIC ICA/CCA RATIO:  0.5 ECA: 132 cm/sec RIGHT CAROTID ARTERY: There is no grayscale evidence of significant intimal thickening or atherosclerotic plaque affecting the interrogated portions of the right carotid system. There are no elevated peak systolic velocities within the interrogated course of the right internal carotid artery to suggest a hemodynamically significant stenosis. RIGHT VERTEBRAL ARTERY:  Antegrade flow LEFT CAROTID ARTERY: There is a minimal amount of eccentric echogenic plaque within left carotid bulb (images 48 and 50), extending to involve the origin and proximal aspects of the left internal carotid artery (image 58), not resulting in elevated peak systolic velocities within the interrogated course of the left internal carotid artery to suggest a hemodynamically significant stenosis. LEFT VERTEBRAL ARTERY:  Antegrade flow IMPRESSION: 1. Minimal amount of left-sided atherosclerotic plaque, not resulting in a hemodynamically significant stenosis. 2. Unremarkable sonographic evaluation of the right carotid system. Electronically Signed   By: Simonne Come M.D.   On: 08/02/22 14:01   MR BRAIN WO CONTRAST  Result Date: 07/23/2022 CLINICAL DATA:  TIA EXAM: MRI HEAD WITHOUT CONTRAST MRA HEAD WITHOUT CONTRAST TECHNIQUE: Multiplanar, multi-echo pulse sequences of the brain and surrounding structures were acquired without intravenous contrast. Angiographic images of the Circle of Willis were acquired using MRA technique without intravenous contrast. COMPARISON:  None Available. FINDINGS: MRI HEAD FINDINGS Brain: There is a small focus of diffusion restriction in the right lentiform nucleus with associated FLAIR signal abnormality consistent with acute to early subacute infarct. There is no hemorrhage or mass effect. There is additional curvilinear diffusion restriction with FLAIR signal abnormality  along the undersurface of the left occipital lobe consistent with additional infarct which appears subacute in chronicity. There is no acute intracranial hemorrhage or extra-axial fluid collection. Parenchymal volume is within expected limits for age. The ventricles are normal in size. Patchy FLAIR signal abnormality throughout the remainder of the supratentorial white matter likely reflects sequela of moderate chronic small-vessel ischemic change. The pituitary and suprasellar region are normal. There is no mass lesion. There is no mass effect or midline shift. Vascular: The major flow voids are normal. The vasculature is assessed in full below. Skull and upper cervical spine: Normal marrow signal. Sinuses/Orbits: The paranasal sinuses are clear. Bilateral lens implants are in place. The globes and orbits are otherwise unremarkable. Other: None. MRA HEAD FINDINGS Anterior circulation: The intracranial ICAs are patent with atherosclerotic irregularity resulting in mild-to-moderate stenosis, left worse than right. The bilateral MCAs are patent, without proximal high-grade stenosis or occlusion. The left A1 segment is occluded. The right A1 segment is normal. The anterior communicating artery is normal. The distal ACA branches appear patent but with multifocal high-grade stenoses of the right A2 and A3 segments. There is no aneurysm or AVM. Posterior circulation: The V4 segments are not included within the field of view. The basilar artery is patent. There is multifocal high-grade stenosis/occlusion of the left P2 segment with no flow related enhancement in the distal PCA branches. The right P1 segment is patent there is occlusion of a superior right P2/P3 segment after a bifurcation. There is no aneurysm  or AVM. Anatomic variants: None. IMPRESSION: 1. Small acute to early subacute infarct in the right lentiform nucleus, and additional subacute appearing cortical infarct along the undersurface of the left occipital  lobe. 2. Occluded left A1 segment and multifocal high-grade stenosis of the right A2 and A3 segments. 3. Multifocal high-grade stenosis/occlusion of the left P2 segment with no flow related enhancement in the distal PCA branches. 4. Occlusion of the superior right P2/P3 segment. Electronically Signed   By: Lesia Hausen M.D.   On: 07/23/2022 14:31   MR ANGIO HEAD WO CONTRAST  Result Date: 07/23/2022 CLINICAL DATA:  TIA EXAM: MRI HEAD WITHOUT CONTRAST MRA HEAD WITHOUT CONTRAST TECHNIQUE: Multiplanar, multi-echo pulse sequences of the brain and surrounding structures were acquired without intravenous contrast. Angiographic images of the Circle of Willis were acquired using MRA technique without intravenous contrast. COMPARISON:  None Available. FINDINGS: MRI HEAD FINDINGS Brain: There is a small focus of diffusion restriction in the right lentiform nucleus with associated FLAIR signal abnormality consistent with acute to early subacute infarct. There is no hemorrhage or mass effect. There is additional curvilinear diffusion restriction with FLAIR signal abnormality along the undersurface of the left occipital lobe consistent with additional infarct which appears subacute in chronicity. There is no acute intracranial hemorrhage or extra-axial fluid collection. Parenchymal volume is within expected limits for age. The ventricles are normal in size. Patchy FLAIR signal abnormality throughout the remainder of the supratentorial white matter likely reflects sequela of moderate chronic small-vessel ischemic change. The pituitary and suprasellar region are normal. There is no mass lesion. There is no mass effect or midline shift. Vascular: The major flow voids are normal. The vasculature is assessed in full below. Skull and upper cervical spine: Normal marrow signal. Sinuses/Orbits: The paranasal sinuses are clear. Bilateral lens implants are in place. The globes and orbits are otherwise unremarkable. Other: None. MRA HEAD  FINDINGS Anterior circulation: The intracranial ICAs are patent with atherosclerotic irregularity resulting in mild-to-moderate stenosis, left worse than right. The bilateral MCAs are patent, without proximal high-grade stenosis or occlusion. The left A1 segment is occluded. The right A1 segment is normal. The anterior communicating artery is normal. The distal ACA branches appear patent but with multifocal high-grade stenoses of the right A2 and A3 segments. There is no aneurysm or AVM. Posterior circulation: The V4 segments are not included within the field of view. The basilar artery is patent. There is multifocal high-grade stenosis/occlusion of the left P2 segment with no flow related enhancement in the distal PCA branches. The right P1 segment is patent there is occlusion of a superior right P2/P3 segment after a bifurcation. There is no aneurysm or AVM. Anatomic variants: None. IMPRESSION: 1. Small acute to early subacute infarct in the right lentiform nucleus, and additional subacute appearing cortical infarct along the undersurface of the left occipital lobe. 2. Occluded left A1 segment and multifocal high-grade stenosis of the right A2 and A3 segments. 3. Multifocal high-grade stenosis/occlusion of the left P2 segment with no flow related enhancement in the distal PCA branches. 4. Occlusion of the superior right P2/P3 segment. Electronically Signed   By: Lesia Hausen M.D.   On: 07/23/2022 14:31    Microbiology: No results found for this or any previous visit (from the past 240 hour(s)).  Time spent: 25 minutes  Signed: Lorin Glass, MD 08/17/2022

## 2022-08-22 NOTE — Progress Notes (Signed)
At 0000hrs of Tue 7 May, 2024 patient passed on. He was comfort care. He was pronounced clinically dead by this RN and Horticulturist, commercial. Nocturnist Odie Sera, MD notified.

## 2022-08-22 DEATH — deceased
